# Patient Record
Sex: Female | Born: 1950 | Race: White | Hispanic: No | Marital: Single | Smoking: Never smoker
Health system: Southern US, Community
[De-identification: ages and names within clinical notes are randomized; demographics above are authoritative.]

## PROBLEM LIST (undated history)

## (undated) DIAGNOSIS — M1990 Unspecified osteoarthritis, unspecified site: Secondary | ICD-10-CM

## (undated) DIAGNOSIS — I10 Essential (primary) hypertension: Secondary | ICD-10-CM

---

## 1998-03-31 ENCOUNTER — Emergency Department (HOSPITAL_COMMUNITY): Admission: EM | Admit: 1998-03-31 | Discharge: 1998-03-31 | Payer: Self-pay | Admitting: *Deleted

## 2000-03-27 DIAGNOSIS — F32A Depression, unspecified: Secondary | ICD-10-CM | POA: Insufficient documentation

## 2004-03-16 ENCOUNTER — Emergency Department: Payer: Self-pay | Admitting: Emergency Medicine

## 2004-07-31 ENCOUNTER — Emergency Department: Payer: Self-pay | Admitting: Emergency Medicine

## 2004-12-28 ENCOUNTER — Emergency Department: Payer: Self-pay | Admitting: Internal Medicine

## 2005-01-22 ENCOUNTER — Emergency Department: Payer: Self-pay | Admitting: Emergency Medicine

## 2005-10-17 ENCOUNTER — Emergency Department: Payer: Self-pay | Admitting: Emergency Medicine

## 2006-01-19 ENCOUNTER — Emergency Department: Payer: Self-pay | Admitting: Emergency Medicine

## 2007-03-26 ENCOUNTER — Ambulatory Visit: Payer: Self-pay | Admitting: Pain Medicine

## 2007-04-01 ENCOUNTER — Ambulatory Visit: Payer: Self-pay | Admitting: Pain Medicine

## 2007-05-26 ENCOUNTER — Ambulatory Visit: Payer: Self-pay | Admitting: Pain Medicine

## 2007-06-01 ENCOUNTER — Ambulatory Visit: Payer: Self-pay | Admitting: Pain Medicine

## 2007-06-08 ENCOUNTER — Ambulatory Visit: Payer: Self-pay | Admitting: Pain Medicine

## 2007-06-22 ENCOUNTER — Ambulatory Visit: Payer: Self-pay | Admitting: Unknown Physician Specialty

## 2007-07-07 ENCOUNTER — Ambulatory Visit: Payer: Self-pay | Admitting: Unknown Physician Specialty

## 2007-07-09 ENCOUNTER — Ambulatory Visit: Payer: Self-pay | Admitting: Pain Medicine

## 2007-07-15 ENCOUNTER — Ambulatory Visit: Payer: Self-pay | Admitting: Pain Medicine

## 2007-07-31 ENCOUNTER — Ambulatory Visit: Payer: Self-pay | Admitting: Unknown Physician Specialty

## 2007-08-04 ENCOUNTER — Ambulatory Visit: Payer: Self-pay | Admitting: Pain Medicine

## 2007-08-12 ENCOUNTER — Ambulatory Visit: Payer: Self-pay | Admitting: Pain Medicine

## 2007-08-15 ENCOUNTER — Emergency Department: Payer: Self-pay | Admitting: Emergency Medicine

## 2007-08-15 ENCOUNTER — Other Ambulatory Visit: Payer: Self-pay

## 2007-08-17 ENCOUNTER — Ambulatory Visit: Payer: Self-pay | Admitting: Unknown Physician Specialty

## 2007-08-20 ENCOUNTER — Ambulatory Visit: Payer: Self-pay | Admitting: Family Medicine

## 2007-09-03 ENCOUNTER — Ambulatory Visit: Payer: Self-pay | Admitting: Pain Medicine

## 2007-09-07 ENCOUNTER — Ambulatory Visit: Payer: Self-pay | Admitting: Pain Medicine

## 2007-09-10 ENCOUNTER — Ambulatory Visit: Payer: Self-pay | Admitting: Family Medicine

## 2007-09-10 ENCOUNTER — Other Ambulatory Visit: Payer: Self-pay

## 2007-09-10 ENCOUNTER — Emergency Department: Payer: Self-pay

## 2007-09-30 ENCOUNTER — Ambulatory Visit: Payer: Self-pay | Admitting: Pain Medicine

## 2007-10-09 ENCOUNTER — Ambulatory Visit: Payer: Self-pay | Admitting: Internal Medicine

## 2007-10-10 ENCOUNTER — Emergency Department: Payer: Self-pay | Admitting: Unknown Physician Specialty

## 2007-10-12 ENCOUNTER — Ambulatory Visit: Payer: Self-pay | Admitting: Pain Medicine

## 2007-10-28 ENCOUNTER — Ambulatory Visit: Payer: Self-pay | Admitting: Unknown Physician Specialty

## 2007-11-05 ENCOUNTER — Ambulatory Visit: Payer: Self-pay | Admitting: Unknown Physician Specialty

## 2007-12-01 ENCOUNTER — Emergency Department: Payer: Self-pay | Admitting: Emergency Medicine

## 2008-02-27 ENCOUNTER — Emergency Department: Payer: Self-pay | Admitting: Emergency Medicine

## 2008-03-02 ENCOUNTER — Ambulatory Visit: Payer: Self-pay | Admitting: Unknown Physician Specialty

## 2008-03-07 ENCOUNTER — Emergency Department: Payer: Self-pay | Admitting: Emergency Medicine

## 2008-04-04 ENCOUNTER — Ambulatory Visit: Payer: Self-pay | Admitting: Pain Medicine

## 2008-04-09 ENCOUNTER — Ambulatory Visit: Payer: Self-pay | Admitting: Family Medicine

## 2008-04-25 ENCOUNTER — Ambulatory Visit: Payer: Self-pay | Admitting: Internal Medicine

## 2008-05-24 ENCOUNTER — Emergency Department: Payer: Self-pay | Admitting: Emergency Medicine

## 2008-07-06 ENCOUNTER — Ambulatory Visit: Payer: Self-pay | Admitting: Gastroenterology

## 2008-07-06 ENCOUNTER — Ambulatory Visit: Payer: Self-pay | Admitting: Unknown Physician Specialty

## 2008-07-11 ENCOUNTER — Ambulatory Visit: Payer: Self-pay | Admitting: Unknown Physician Specialty

## 2008-07-15 ENCOUNTER — Ambulatory Visit: Payer: Self-pay | Admitting: Internal Medicine

## 2008-08-02 ENCOUNTER — Ambulatory Visit: Payer: Self-pay | Admitting: Unknown Physician Specialty

## 2008-08-03 ENCOUNTER — Emergency Department: Payer: Self-pay | Admitting: Emergency Medicine

## 2008-10-12 ENCOUNTER — Ambulatory Visit: Payer: Self-pay | Admitting: Family Medicine

## 2008-10-16 ENCOUNTER — Ambulatory Visit: Payer: Self-pay | Admitting: Internal Medicine

## 2008-11-01 ENCOUNTER — Ambulatory Visit: Payer: Self-pay | Admitting: Unknown Physician Specialty

## 2009-01-01 ENCOUNTER — Emergency Department: Payer: Self-pay | Admitting: Emergency Medicine

## 2009-03-02 ENCOUNTER — Ambulatory Visit: Payer: Self-pay | Admitting: Gastroenterology

## 2009-03-24 ENCOUNTER — Emergency Department: Payer: Self-pay | Admitting: Emergency Medicine

## 2009-04-14 ENCOUNTER — Emergency Department: Payer: Self-pay | Admitting: Internal Medicine

## 2009-05-15 ENCOUNTER — Emergency Department: Payer: Self-pay

## 2009-05-22 DIAGNOSIS — M797 Fibromyalgia: Secondary | ICD-10-CM | POA: Insufficient documentation

## 2009-05-22 DIAGNOSIS — M1990 Unspecified osteoarthritis, unspecified site: Secondary | ICD-10-CM | POA: Insufficient documentation

## 2009-05-22 DIAGNOSIS — K2100 Gastro-esophageal reflux disease with esophagitis, without bleeding: Secondary | ICD-10-CM | POA: Insufficient documentation

## 2009-06-02 ENCOUNTER — Ambulatory Visit: Payer: Self-pay | Admitting: Family Medicine

## 2009-06-12 ENCOUNTER — Emergency Department: Payer: Self-pay | Admitting: Emergency Medicine

## 2009-06-13 ENCOUNTER — Emergency Department: Payer: Self-pay | Admitting: Emergency Medicine

## 2009-07-15 ENCOUNTER — Emergency Department: Payer: Self-pay | Admitting: Unknown Physician Specialty

## 2009-07-18 ENCOUNTER — Ambulatory Visit: Payer: Self-pay | Admitting: Gastroenterology

## 2009-07-20 ENCOUNTER — Ambulatory Visit: Payer: Self-pay | Admitting: Gastroenterology

## 2009-09-03 ENCOUNTER — Emergency Department: Payer: Self-pay | Admitting: Unknown Physician Specialty

## 2009-09-29 DIAGNOSIS — I517 Cardiomegaly: Secondary | ICD-10-CM | POA: Insufficient documentation

## 2010-02-02 ENCOUNTER — Emergency Department: Payer: Self-pay | Admitting: Emergency Medicine

## 2010-02-13 ENCOUNTER — Ambulatory Visit: Payer: Self-pay | Admitting: Internal Medicine

## 2010-04-02 ENCOUNTER — Ambulatory Visit: Payer: Self-pay | Admitting: Unknown Physician Specialty

## 2010-04-02 DIAGNOSIS — I10 Essential (primary) hypertension: Secondary | ICD-10-CM

## 2010-04-09 ENCOUNTER — Ambulatory Visit: Payer: Self-pay | Admitting: Internal Medicine

## 2010-04-12 ENCOUNTER — Emergency Department: Payer: Self-pay | Admitting: Emergency Medicine

## 2010-05-14 ENCOUNTER — Inpatient Hospital Stay: Payer: Self-pay | Admitting: Unknown Physician Specialty

## 2010-08-10 ENCOUNTER — Emergency Department: Payer: Self-pay | Admitting: Emergency Medicine

## 2010-08-18 ENCOUNTER — Emergency Department: Payer: Self-pay | Admitting: Emergency Medicine

## 2010-08-22 ENCOUNTER — Emergency Department: Payer: Self-pay | Admitting: Unknown Physician Specialty

## 2010-09-25 ENCOUNTER — Ambulatory Visit: Payer: Self-pay | Admitting: Gastroenterology

## 2011-03-06 IMAGING — NM NUCLEAR MEDICINE HEPATOHBILIARY INCLUDE GB
2 series · 12 of 12 positions shown · non-contrast
Comparison: none

REASON FOR EXAM: RUQ pain  epigastric pain
COMMENTS:

[Series 1000: gallbladder dynamic · 4.80mm/px · 6 of 60 frames shown]
[frame 6/60]
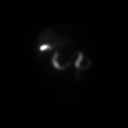
[frame 16/60]
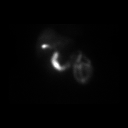
[frame 26/60]
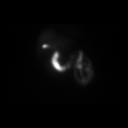
[frame 36/60]
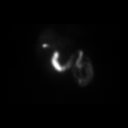
[frame 46/60]
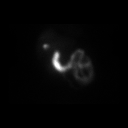
[frame 56/60]
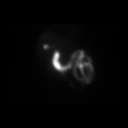

[Series 1000: gallbladder dynamic (results) · 4.80mm/px · 6 of 60 frames shown]
[frame 6/60]
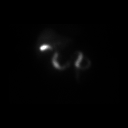
[frame 16/60]
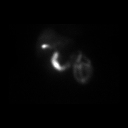
[frame 26/60]
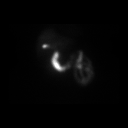
[frame 36/60]
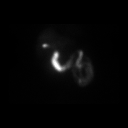
[frame 46/60]
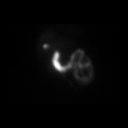
[frame 56/60]
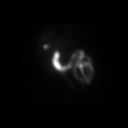

[12 of 12 positions shown; findings below may reference images not displayed]

PROCEDURE:     NM  - NM HEPATO WITH GB EJECT FRACTION  - July 11, 2008 [DATE]

RESULT:     The patient received an injection of 7.86 mCi of technetium 99m
Choletec. There is prompt extraction of activity from the blood pool by the
liver. There is evidence of activity within the gallbladder and common bile
duct by 10 minutes with small bowel activity well seen by 15 minutes and
progression of increasing activity in the gallbladder and small bowel
throughout the exam. A continuous infusion of sincalide was initiated with a
total infused dose measuring 2.5 mcg administered intravenously over 30
minutes. The gallbladder ejection fraction in response to sincalide infusion
is 89%.
IMPRESSION: 1.     Normal-appearing hepatobiliary scan and gallbladder ejection fraction.

## 2011-04-23 ENCOUNTER — Ambulatory Visit: Payer: Self-pay | Admitting: Internal Medicine

## 2011-06-04 ENCOUNTER — Ambulatory Visit: Payer: Self-pay | Admitting: Otolaryngology

## 2011-06-11 ENCOUNTER — Inpatient Hospital Stay: Payer: Self-pay | Admitting: Otolaryngology

## 2011-06-11 LAB — BASIC METABOLIC PANEL WITH GFR
Anion Gap: 8
BUN: 8 mg/dL
Calcium, Total: 7.9 mg/dL — ABNORMAL LOW
Chloride: 102 mmol/L
Co2: 27 mmol/L
Creatinine: 0.84 mg/dL
EGFR (African American): >60
EGFR (Non-African Amer.): >60
Glucose: 130 mg/dL — ABNORMAL HIGH
Osmolality: 274
Potassium: 4 mmol/L
Sodium: 137 mmol/L

## 2011-06-11 LAB — CALCIUM: Calcium, Total: 7.4 mg/dL — ABNORMAL LOW

## 2011-06-11 LAB — MAGNESIUM: Magnesium: 1.8 mg/dL

## 2011-06-11 LAB — ALBUMIN: Albumin: 3.3 g/dL — ABNORMAL LOW

## 2011-06-12 LAB — BASIC METABOLIC PANEL
Anion Gap: 8 (ref 7–16)
Calcium, Total: 7.4 mg/dL — ABNORMAL LOW (ref 8.5–10.1)
Chloride: 101 mmol/L (ref 98–107)
Co2: 29 mmol/L (ref 21–32)
Creatinine: 0.82 mg/dL (ref 0.60–1.30)
EGFR (African American): >60
EGFR (Non-African Amer.): >60
Glucose: 115 mg/dL — ABNORMAL HIGH (ref 65–99)
Osmolality: 276 (ref 275–301)
Potassium: 4 mmol/L (ref 3.5–5.1)

## 2011-06-13 LAB — PATHOLOGY REPORT

## 2011-06-13 LAB — CALCIUM
Calcium, Total: 7.3 mg/dL — ABNORMAL LOW (ref 8.5–10.1)
Calcium, Total: 6.9 mg/dL — CL (ref 8.5–10.1)

## 2011-06-14 LAB — CALCIUM: Calcium, Total: 7.5 mg/dL — ABNORMAL LOW (ref 8.5–10.1)

## 2011-06-20 DIAGNOSIS — E209 Hypoparathyroidism, unspecified: Secondary | ICD-10-CM | POA: Insufficient documentation

## 2012-08-10 DIAGNOSIS — G2581 Restless legs syndrome: Secondary | ICD-10-CM | POA: Insufficient documentation

## 2012-08-10 DIAGNOSIS — E039 Hypothyroidism, unspecified: Secondary | ICD-10-CM | POA: Insufficient documentation

## 2012-08-10 DIAGNOSIS — J449 Chronic obstructive pulmonary disease, unspecified: Secondary | ICD-10-CM | POA: Insufficient documentation

## 2012-08-11 DIAGNOSIS — Z8639 Personal history of other endocrine, nutritional and metabolic disease: Secondary | ICD-10-CM | POA: Insufficient documentation

## 2012-08-11 DIAGNOSIS — G43909 Migraine, unspecified, not intractable, without status migrainosus: Secondary | ICD-10-CM | POA: Insufficient documentation

## 2012-08-11 DIAGNOSIS — R9431 Abnormal electrocardiogram [ECG] [EKG]: Secondary | ICD-10-CM | POA: Insufficient documentation

## 2012-10-21 DIAGNOSIS — G4700 Insomnia, unspecified: Secondary | ICD-10-CM | POA: Insufficient documentation

## 2013-01-13 ENCOUNTER — Emergency Department: Payer: Self-pay | Admitting: Emergency Medicine

## 2013-03-14 ENCOUNTER — Ambulatory Visit: Payer: Self-pay | Admitting: Internal Medicine

## 2013-03-14 LAB — RAPID INFLUENZA A&B ANTIGENS

## 2014-05-15 NOTE — Op Note (Signed)
PATIENT NAME:  Stephanie Mooney, Stephanie Mooney MR#:  [id number] DATE OF BIRTH:  02/27/1950  DATE OF PROCEDURE:  06/10/2011  PREOPERATIVE DIAGNOSES:  1. Nontoxic multinodular goiter.  2. Dysphagia with compressive symptoms.  POSTOPERATIVE DIAGNOSES:  1. Nontoxic multinodular goiter.  2. Dysphagia with compressive symptoms.   PROCEDURE PERFORMED: Minimally invasive total thyroidectomy with laryngeal nerve monitor.   SURGEON: Kyung Rudd, MD   ASSISTANT:  Fredrich Birks, MD  ANESTHESIA: General endotracheal anesthesia.   ESTIMATED BLOOD LOSS: 25.   IV FLUIDS: Please see anesthesia record.   COMPLICATIONS: None.   DRAINS/STENT PLACEMENTS: Surgicel and TLS drain.   SPECIMENS: Total thyroid.   INDICATIONS FOR PROCEDURE: The patient is a 64 year old female with a longstanding history of nontoxic multinodular goiter and compressive-type symptoms.   OPERATIVE FINDINGS: Numerable nodules in a large nontoxic multinodular goiter right side dominant. Bilateral recurrent laryngeal nerves were identified and monitored throughout the duration of the case and stimulated well into the case. Bilateral superior parathyroid glands identified and preserved and the left inferior parathyroid gland identified and preserved.   DESCRIPTION OF PROCEDURE: After the patient was identified in holding, benefits and risks of the procedure were discussed and consent was reviewed, the patient was taken to the operating room and placed in the supine position. General endotracheal anesthesia was induced and the patient was prepped and draped in a sterile fashion. Of note, general anesthesia was induced with endotracheal tube with laryngeal monitoring device attached. Intubation was performed with the glide laryngoscope for direct visualization.   The patient was prepped at this point and draped in a sterile fashion. A previously marked skin crease was used for an incision with a #15 blade. Dissection was carried down through  the subcutaneous tissues to a layer of the strap muscles using Bovie electrocautery. The median raphe was divided along vertically from the thyroid notch down to the sternal notch and dissection was carried down to the isthmus of the thyroid gland. Attention at this time was directed to the patient's right hemi thyroid. Using terris elevators, the sternohyoid and sternothyroid muscles were separated from the thyroid capsule. Dissection was carried laterally. This demonstrated large multinodular goiters that extended back posterior to the patient's trachea. Dissection was carried superiorly to the lateral edge to the superior pole and medially between the thyroid cartilage and the superior pole for isolation of the right superior thyroid vessels. These were ligated with a Harmonic scalpel. The right hemi thyroid was continued to be mobilized inferiorly. This was retracted out of incision site and dissection bluntly with a combination of a terris elevator and a pediatric right angle was made in the tracheoesophageal groove. The right recurrent laryngeal nerve was identified. A right superior parathyroid gland was also identified in this position. Careful dissection around the nerve was made and the nerve was stimulated robustly.  The remaining attachments of the right hemi thyroid were divided and Berry's ligament was divided and care was taken to avoid any injury to the right recurrent laryngeal nerve. The remaining attachments of the inferior pole were also divided and dissection was taken to the midline of the trachea on the isthmus. At this time, attention was directed to the patient's left hemi thyroid.  In a similar fashion, sternohyoid and sternothyroid muscles were separated from the thyroid gland using blunt techniques. Again innumerable nodules were identified in the left hemi-thyroid with a large superior dominant nodule that was hard in nature. The left superior pole was mobilized laterally and medially  through use of Joels using terris elevators. The left superior thyroid vessels were ligated using a Harmonic scalpel. Attention at this time was directed to mobilizing the gland inferiorly. This was done with the use of blunt techniques and the left thyroid gland was retracted and delivered through the incision site. Dissection was made in the tracheoesophageal groove for evaluation of the left recurrent laryngeal nerve. The left superior parathyroid gland was identified and preserved as well as the left inferior parathyroid gland was identified and preserved. The left recurrent laryngeal nerve was noted to be coursing in the tracheoesophageal groove. This was dissected until its insertion into the larynx and at this time with the nerve in total view the remaining attachments of the left hemi thyroid were divided as well as Berry's ligament was separated, and the remaining attachments of the thyroid to the trachea were divided as well. The total thyroid specimen was passed off the table for permanent pathological evaluation and a stitch was placed in the right superior pole. Visualization of the thyroid did not reveal any parathyroid nodules.   At this time, the patient's neck was copiously irrigated with sterile saline. The thyroid beds were visualized and hemostasis was achieved. Surgicel was placed along the thyroid bed bilaterally, and the strap muscles were closed in a figure-of-eight fashion [id number] Vicryl. Vicryl was also used to reapproximate subcutaneous tissues in an interrupted fashion and the skin was closed with Dermabond skin adhesive. Prior to closing of the skin and strap muscles, a small TLS drain was placed in the patient's left neck. At this time, the patient tolerated the procedure well and was taken to the PAC-U in good condition. .   ____________________________ Kyung Ruddreighton C. Crystall Donaldson, MD ccv:drc D: 06/10/2011 12:57:33 ET T: 06/10/2011 13:15:43 ET JOB#: [id number]  cc: Kyung Ruddreighton C. Heidi Lemay,  MD, <Dictator> Kyung RuddREIGHTON C Tarrell Debes MD ELECTRONICALLY SIGNED 06/10/2011 14:40

## 2014-05-15 NOTE — Consult Note (Signed)
PATIENT NAME:  Stephanie Mooney, Stephanie Mooney MR#:  [id number] DATE OF BIRTH:  [birth year]  DATE OF CONSULTATION:  06/10/2011  REFERRING PHYSICIAN:  Bud Facereighton Vaught, MD, ENT  CONSULTING PHYSICIAN:  Herschell Dimesichard J. Renae GlossWieting, MD   PRIMARY CARE PHYSICIAN[facility]   REASON FOR CONSULTATION: Medical management postop thyroidectomy.   HISTORY OF PRESENT ILLNESS: This is a 64 year old female with multiple medical issues including fibromyalgia, anxiety, gastroesophageal reflux disease, hypertension, kidney stones, headaches, and osteoarthritis. She was admitted electively for a thyroidectomy by Dr. Andee PolesVaught secondary to multinodular goiter, dysphasia with compressive symptoms, and enlarging nodules. The patient postop is feeling very tired, severe pain 8 out of 10 in intensity, was sleeping most of the time while history was obtained from old chart and daughter who is at the bedside. The patient does complain of 8 out of 10 pain in her throat. Hospitalist services were contacted for medical management status post thyroidectomy. Current vital signs include blood pressure 135/86, temperature 97.2, pulse 72, respirations 16, pulse oximetry 96% on room air. The patient is currently feeling fine besides the neck pain status post surgery.   PAST MEDICAL HISTORY:  1. Anxiety. 2. Fibromyalgia. 3. Gastric ulcer.  4. Gastroesophageal reflux disease.  5. Hypertension.  6. Kidney stones. 7. Migraine headache. 8. Osteoarthritis.   PAST SURGICAL HISTORY:  1. Knee replacement. 2. D and C.  3. Tonsillectomy.  4. Tubal ligation. 5. Arthroscopy of chondral lesions.  6. Partial medial meniscectomy.   7. Thyroidectomy today.   DRUG ALLERGIES: Celebrex, Cymbalta, Daypro, Feldene, Flexeril, gabapentin, Lyrica, and NSAIDs.  MEDICATIONS AT HOME:  1. Albuterol p.r.n.  2. Wellbutrin 300 mg daily.  3. Celexa 20 mg daily.  4. Lisinopril 10 mg daily.  5. Metoprolol ER 50 mg daily.  6. Remeron 15 mg at bedtime.   7. Nasonex p.r.n. 8. Nucynta 100 mg every six hours.  9. Omeprazole 20 mg daily.  10. Requip 2 mg at bedtime.   SOCIAL HISTORY: Positive for smoking. No alcohol. No drug use. Lives alone. Is on disability.   FAMILY HISTORY: Both parents are living. Father with atrial fibrillation and a defibrillator. Mother with CVA.   REVIEW OF SYSTEMS: CONSTITUTIONAL: No fever, chills, or sweats. No weight loss. No weight gain. EYES: She does wear glasses. EARS, NOSE, MOUTH, AND THROAT: No hearing loss or sore throat. No difficulty swallowing. CARDIOVASCULAR: No chest pain. No palpitations. RESPIRATORY: No shortness of breath. Positive coughing. No sputum. No hemoptysis. GASTROINTESTINAL: No nausea. No vomiting. No abdominal pain. No diarrhea. No constipation. No bright red blood per rectum. No melena. GENITOURINARY: No burning on urination. No hematuria. MUSCULOSKELETAL: No joint pain. INTEGUMENTARY: No rashes or eruptions. NEUROLOGIC: No fainting or blackouts. PSYCHIATRIC: Positive for anxiety and depression. ENDOCRINE: Status post thyroidectomy today. HEMATOLOGIC/LYMPHATIC: No anemia. No easy bruising or bleeding.   PHYSICAL EXAMINATION:   VITAL SIGNS: Blood pressure 135/86, pulse oximetry 96% on room air, respirations 16, pulse 72, temperature 97.8.   GENERAL: Obese female lying in bed. No respiratory distress.   EYES: Conjunctivae and lids normal. Pupils equal, round, and reactive to light. Extraocular muscles intact. No nystagmus.   EARS, NOSE, MOUTH, AND THROAT: Nasal mucosa no erythema. Throat no erythema. No exudate seen. Lips and gums no lesions.   NECK: Positive scar over the neck. Did not palpate area secondary to recent surgery.   RESPIRATORY: Lungs clear to auscultation. No use of accessory muscles to breathe. No rhonchi, rales, or wheeze heard.   CARDIOVASCULAR: S1, S2  normal. No gallops, rubs, or murmurs heard. Carotid upstroke 2+ bilaterally. No bruits. Dorsalis pedis pulses 2+  bilaterally. Trace edema of the lower extremity.   ABDOMEN: Soft, nontender. No organomegaly/splenomegaly. Normoactive bowel sounds.   LYMPHATIC: No lymph nodes in the neck.   MUSCULOSKELETAL: Trace edema. No clubbing. No cyanosis.   SKIN: No ulcers or lesions.   NEUROLOGIC: Cranial nerves II through XII grossly intact.   PSYCHIATRIC: The patient is oriented to person, place, and time, sleepy after procedure.   ASSESSMENT AND PLAN:  1. Hypertension, blood pressure currently well controlled on metoprolol and lisinopril. Will continue.  2. Status post thyroidectomy. Pain control with Nucynta. Morphine only if severe pain. The patient was started on thyroid replacement and need to watch calcium closely. There is a calcium ordered for later on today and in the morning. The patient is also on calcium supplementation. Surgery following.  3. Anxiety/depression. Continue Celexa and Remeron. Will hold on Wellbutrin today.  4. Gastroesophageal reflux disease. Continue omeprazole.  5. Restless leg syndrome. Continue Requip.  6. Obesity. BMI 54.7. Weight loss recommended.  7. Check a BMP in the a.m. with IV fluids ordered by surgeon.  8. Tobacco abuse. Will put on nicotine patch p.r.n.   TIME SPENT ON CONSULTATION: 50 minutes.   Thank you for the consultation. Will continue to follow while here in the hospital.   ____________________________ Herschell Dimes. Renae Gloss, MD rjw:drc D: 06/10/2011 16:25:29 ET T: 06/10/2011 16:52:44 ET JOB#: [id number]  cc: Herschell Dimes. Renae Gloss, MD, <Dictator> Kyung Rudd, MD Salley Scarlet MD ELECTRONICALLY SIGNED 06/13/2011 13:17

## 2014-06-02 DIAGNOSIS — G894 Chronic pain syndrome: Secondary | ICD-10-CM | POA: Insufficient documentation

## 2015-04-07 DIAGNOSIS — E785 Hyperlipidemia, unspecified: Secondary | ICD-10-CM | POA: Insufficient documentation

## 2016-12-26 DIAGNOSIS — M159 Polyosteoarthritis, unspecified: Secondary | ICD-10-CM | POA: Insufficient documentation

## 2018-03-10 DIAGNOSIS — Z0289 Encounter for other administrative examinations: Secondary | ICD-10-CM | POA: Insufficient documentation

## 2019-01-11 ENCOUNTER — Emergency Department: Payer: Medicare HMO

## 2019-01-11 ENCOUNTER — Inpatient Hospital Stay
Admission: EM | Admit: 2019-01-11 | Discharge: 2019-01-15 | DRG: 177 | Disposition: A | Discharge status: Home Health | Payer: Medicare HMO | Attending: Hospitalist | Admitting: Hospitalist

## 2019-01-11 DIAGNOSIS — Z886 Allergy status to analgesic agent status: Secondary | ICD-10-CM

## 2019-01-11 DIAGNOSIS — J1289 Other viral pneumonia: Secondary | ICD-10-CM | POA: Diagnosis present

## 2019-01-11 DIAGNOSIS — E871 Hypo-osmolality and hyponatremia: Secondary | ICD-10-CM | POA: Diagnosis present

## 2019-01-11 DIAGNOSIS — M1990 Unspecified osteoarthritis, unspecified site: Secondary | ICD-10-CM | POA: Diagnosis present

## 2019-01-11 DIAGNOSIS — Z23 Encounter for immunization: Secondary | ICD-10-CM

## 2019-01-11 DIAGNOSIS — U071 COVID-19: Principal | ICD-10-CM

## 2019-01-11 DIAGNOSIS — G2581 Restless legs syndrome: Secondary | ICD-10-CM | POA: Diagnosis present

## 2019-01-11 DIAGNOSIS — J9601 Acute respiratory failure with hypoxia: Secondary | ICD-10-CM

## 2019-01-11 DIAGNOSIS — E559 Vitamin D deficiency, unspecified: Secondary | ICD-10-CM | POA: Diagnosis present

## 2019-01-11 DIAGNOSIS — E785 Hyperlipidemia, unspecified: Secondary | ICD-10-CM | POA: Diagnosis present

## 2019-01-11 DIAGNOSIS — G8929 Other chronic pain: Secondary | ICD-10-CM | POA: Diagnosis present

## 2019-01-11 DIAGNOSIS — E213 Hyperparathyroidism, unspecified: Secondary | ICD-10-CM | POA: Diagnosis present

## 2019-01-11 DIAGNOSIS — J440 Chronic obstructive pulmonary disease with acute lower respiratory infection: Secondary | ICD-10-CM | POA: Diagnosis present

## 2019-01-11 DIAGNOSIS — Z66 Do not resuscitate: Secondary | ICD-10-CM | POA: Diagnosis present

## 2019-01-11 DIAGNOSIS — E890 Postprocedural hypothyroidism: Secondary | ICD-10-CM | POA: Diagnosis present

## 2019-01-11 DIAGNOSIS — I10 Essential (primary) hypertension: Secondary | ICD-10-CM | POA: Diagnosis present

## 2019-01-11 DIAGNOSIS — M797 Fibromyalgia: Secondary | ICD-10-CM | POA: Diagnosis present

## 2019-01-11 DIAGNOSIS — Z6841 Body Mass Index (BMI) 40.0 and over, adult: Secondary | ICD-10-CM

## 2019-01-11 DIAGNOSIS — N390 Urinary tract infection, site not specified: Secondary | ICD-10-CM | POA: Diagnosis present

## 2019-01-11 DIAGNOSIS — Z888 Allergy status to other drugs, medicaments and biological substances status: Secondary | ICD-10-CM

## 2019-01-11 DIAGNOSIS — E875 Hyperkalemia: Secondary | ICD-10-CM | POA: Diagnosis present

## 2019-01-11 HISTORY — DX: Unspecified osteoarthritis, unspecified site: M19.90

## 2019-01-11 HISTORY — DX: Essential (primary) hypertension: I10

## 2019-01-11 LAB — CBC
HCT: 42.5 % (ref 36.0–46.0)
Hemoglobin: 13.8 g/dL (ref 12.0–15.0)
MCH: 27.4 pg (ref 26.0–34.0)
MCHC: 32.5 g/dL (ref 30.0–36.0)
MCV: 84.5 fL (ref 80.0–100.0)
Platelets: 154 10*3/uL (ref 150–400)
RBC: 5.03 MIL/uL (ref 3.87–5.11)
RDW: 14.6 % (ref 11.5–15.5)
WBC: 4.5 10*3/uL (ref 4.0–10.5)
nRBC: 0 % (ref 0.0–0.2)

## 2019-01-11 LAB — FIBRIN DERIVATIVES D-DIMER (ARMC ONLY): Fibrin derivatives D-dimer (ARMC): 718.55 ng/mL (FEU) — ABNORMAL HIGH (ref 0.00–499.00)

## 2019-01-11 LAB — BASIC METABOLIC PANEL
Anion gap: 11 (ref 5–15)
BUN: 19 mg/dL (ref 8–23)
CO2: 28 mmol/L (ref 22–32)
Calcium: 7.9 mg/dL — ABNORMAL LOW (ref 8.9–10.3)
Chloride: 94 mmol/L — ABNORMAL LOW (ref 98–111)
Creatinine, Ser: 0.65 mg/dL (ref 0.44–1.00)
GFR calc Af Amer: >60 mL/min (ref >60)
GFR calc non Af Amer: >60 mL/min (ref >60)
Glucose, Bld: 122 mg/dL — ABNORMAL HIGH (ref 70–99)
Potassium: 4.6 mmol/L (ref 3.5–5.1)
Sodium: 133 mmol/L — ABNORMAL LOW (ref 135–145)

## 2019-01-11 LAB — HEPATIC FUNCTION PANEL
ALT: 27 U/L (ref 0–44)
AST: 39 U/L (ref 15–41)
Albumin: 3.4 g/dL — ABNORMAL LOW (ref 3.5–5.0)
Alkaline Phosphatase: 73 U/L (ref 38–126)
Bilirubin, Direct: 0.2 mg/dL (ref 0.0–0.2)
Indirect Bilirubin: 0.6 mg/dL (ref 0.3–0.9)
Total Bilirubin: 0.8 mg/dL (ref 0.3–1.2)
Total Protein: 6.6 g/dL (ref 6.5–8.1)

## 2019-01-11 LAB — URINALYSIS, COMPLETE (UACMP) WITH MICROSCOPIC
Bilirubin Urine: NEGATIVE
Glucose, UA: NEGATIVE mg/dL
Hgb urine dipstick: NEGATIVE
Ketones, ur: NEGATIVE mg/dL
Nitrite: POSITIVE — AB
Protein, ur: 30 mg/dL — AB
Specific Gravity, Urine: 1.024 (ref 1.005–1.030)
WBC, UA: >50 WBC/hpf — ABNORMAL HIGH (ref 0–5)
pH: 6 (ref 5.0–8.0)

## 2019-01-11 LAB — ABO/RH: ABO/RH(D): A NEG

## 2019-01-11 LAB — C-REACTIVE PROTEIN: CRP: 2.8 mg/dL — ABNORMAL HIGH (ref <1.0)

## 2019-01-11 LAB — PROCALCITONIN: Procalcitonin: <0.1 ng/mL

## 2019-01-11 MED ORDER — LEVOTHYROXINE SODIUM 25 MCG PO TABS
125.0000 ug | ORAL_TABLET | Freq: Every day | ORAL | Status: DC
  Administered 2019-01-12 – 2019-01-15 (×4): 125 ug via ORAL
  Filled 2019-01-11 (×4): qty 1

## 2019-01-11 MED ORDER — THIAMINE HCL 100 MG PO TABS
100.0000 mg | ORAL_TABLET | Freq: Every day | ORAL | Status: DC
  Administered 2019-01-11 – 2019-01-15 (×5): 100 mg via ORAL
  Filled 2019-01-11 (×5): qty 1

## 2019-01-11 MED ORDER — ONDANSETRON HCL 4 MG/2ML IJ SOLN: 4.0000 mg | Freq: Once | INTRAMUSCULAR | Status: DC

## 2019-01-11 MED ORDER — ADULT MULTIVITAMIN W/MINERALS CH
1.0000 | ORAL_TABLET | Freq: Every day | ORAL | Status: DC
  Administered 2019-01-11 – 2019-01-14 (×4): 1 via ORAL
  Filled 2019-01-11 (×4): qty 1

## 2019-01-11 MED ORDER — SODIUM CHLORIDE 0.9 % IV SOLN: Freq: Once | INTRAVENOUS | Status: DC

## 2019-01-11 MED ORDER — TRAMADOL HCL 50 MG PO TABS
50.0000 mg | ORAL_TABLET | Freq: Once | ORAL | Status: AC
  Administered 2019-01-11: 50 mg via ORAL
  Filled 2019-01-11: qty 1

## 2019-01-11 MED ORDER — ACETAMINOPHEN 325 MG PO TABS
650.0000 mg | ORAL_TABLET | Freq: Four times a day (QID) | ORAL | Status: DC | PRN
  Administered 2019-01-13: 650 mg via ORAL
  Filled 2019-01-11: qty 2

## 2019-01-11 MED ORDER — FOLIC ACID 1 MG PO TABS
1.0000 mg | ORAL_TABLET | Freq: Every day | ORAL | Status: DC
  Administered 2019-01-11 – 2019-01-15 (×5): 1 mg via ORAL
  Filled 2019-01-11 (×5): qty 1

## 2019-01-11 MED ORDER — CYCLOBENZAPRINE HCL 10 MG PO TABS
5.0000 mg | ORAL_TABLET | Freq: Every day | ORAL | Status: DC
  Administered 2019-01-11 – 2019-01-14 (×4): 5 mg via ORAL
  Filled 2019-01-11 (×4): qty 1

## 2019-01-11 MED ORDER — METOPROLOL SUCCINATE ER 50 MG PO TB24
50.0000 mg | ORAL_TABLET | Freq: Every day | ORAL | Status: DC
  Administered 2019-01-11: 50 mg via ORAL
  Filled 2019-01-11 (×2): qty 1

## 2019-01-11 MED ORDER — ATORVASTATIN CALCIUM 10 MG PO TABS
10.0000 mg | ORAL_TABLET | Freq: Every day | ORAL | Status: DC
  Administered 2019-01-11 – 2019-01-15 (×5): 10 mg via ORAL
  Filled 2019-01-11 (×5): qty 1

## 2019-01-11 MED ORDER — BISACODYL 5 MG PO TBEC: 5.0000 mg | DELAYED_RELEASE_TABLET | Freq: Every day | ORAL | Status: DC | PRN

## 2019-01-11 MED ORDER — ACETAMINOPHEN 500 MG PO TABS
1000.0000 mg | ORAL_TABLET | Freq: Once | ORAL | Status: AC
  Administered 2019-01-11: 1000 mg via ORAL
  Filled 2019-01-11: qty 2

## 2019-01-11 MED ORDER — LISINOPRIL 10 MG PO TABS
10.0000 mg | ORAL_TABLET | Freq: Every day | ORAL | Status: DC
  Administered 2019-01-11: 10 mg via ORAL
  Filled 2019-01-11 (×3): qty 1

## 2019-01-11 MED ORDER — SODIUM CHLORIDE 0.9% FLUSH: 10.0000 mL | INTRAVENOUS | Status: DC | PRN

## 2019-01-11 MED ORDER — SODIUM CHLORIDE 0.9 % IV SOLN
200.0000 mg | Freq: Once | INTRAVENOUS | Status: AC
  Administered 2019-01-11: 200 mg via INTRAVENOUS
  Filled 2019-01-11: qty 200

## 2019-01-11 MED ORDER — SODIUM CHLORIDE 0.9 % IV SOLN
1.0000 g | Freq: Once | INTRAVENOUS | Status: AC
  Administered 2019-01-11: 1 g via INTRAVENOUS
  Filled 2019-01-11: qty 10

## 2019-01-11 MED ORDER — ASCORBIC ACID 500 MG PO TABS
500.0000 mg | ORAL_TABLET | Freq: Every day | ORAL | Status: DC
  Administered 2019-01-11 – 2019-01-15 (×5): 500 mg via ORAL
  Filled 2019-01-11 (×4): qty 1

## 2019-01-11 MED ORDER — ROPINIROLE HCL 1 MG PO TABS
4.0000 mg | ORAL_TABLET | Freq: Every day | ORAL | Status: DC
  Administered 2019-01-11 – 2019-01-14 (×4): 4 mg via ORAL
  Filled 2019-01-11 (×4): qty 4

## 2019-01-11 MED ORDER — DEXAMETHASONE SODIUM PHOSPHATE 10 MG/ML IJ SOLN
6.0000 mg | INTRAMUSCULAR | Status: DC
  Administered 2019-01-12 – 2019-01-14 (×3): 6 mg via INTRAVENOUS
  Filled 2019-01-11 (×4): qty 0.6

## 2019-01-11 MED ORDER — SODIUM CHLORIDE 0.9 % IV SOLN
1.0000 g | INTRAVENOUS | Status: DC
  Filled 2019-01-11: qty 10

## 2019-01-11 MED ORDER — DOCUSATE SODIUM 100 MG PO CAPS
100.0000 mg | ORAL_CAPSULE | Freq: Two times a day (BID) | ORAL | Status: DC
  Administered 2019-01-11 – 2019-01-15 (×8): 100 mg via ORAL
  Filled 2019-01-11 (×8): qty 1

## 2019-01-11 MED ORDER — MOMETASONE FURO-FORMOTEROL FUM 200-5 MCG/ACT IN AERO
2.0000 | INHALATION_SPRAY | Freq: Two times a day (BID) | RESPIRATORY_TRACT | Status: DC
  Administered 2019-01-11 – 2019-01-15 (×8): 2 via RESPIRATORY_TRACT
  Filled 2019-01-11: qty 8.8

## 2019-01-11 MED ORDER — SODIUM CHLORIDE 0.9 % IV SOLN: INTRAVENOUS | Status: DC

## 2019-01-11 MED ORDER — ONDANSETRON HCL 4 MG PO TABS: 4.0000 mg | ORAL_TABLET | Freq: Four times a day (QID) | ORAL | Status: DC | PRN

## 2019-01-11 MED ORDER — SODIUM CHLORIDE 0.9 % IV BOLUS
1000.0000 mL | Freq: Once | INTRAVENOUS | Status: AC
  Administered 2019-01-11: 1000 mL via INTRAVENOUS

## 2019-01-11 MED ORDER — SODIUM CHLORIDE 0.9 % IV SOLN
100.0000 mg | Freq: Every day | INTRAVENOUS | Status: AC
  Administered 2019-01-12 – 2019-01-15 (×4): 100 mg via INTRAVENOUS
  Filled 2019-01-11: qty 100
  Filled 2019-01-11: qty 20
  Filled 2019-01-11: qty 100
  Filled 2019-01-11: qty 20

## 2019-01-11 MED ORDER — ENOXAPARIN SODIUM 80 MG/0.8ML ~~LOC~~ SOLN
65.0000 mg | SUBCUTANEOUS | Status: DC
  Administered 2019-01-11 – 2019-01-14 (×4): 65 mg via SUBCUTANEOUS
  Filled 2019-01-11 (×4): qty 0.8

## 2019-01-11 MED ORDER — ONDANSETRON HCL 4 MG/2ML IJ SOLN: 4.0000 mg | Freq: Four times a day (QID) | INTRAMUSCULAR | Status: DC | PRN

## 2019-01-11 MED ORDER — SODIUM CHLORIDE 0.9% FLUSH
10.0000 mL | Freq: Two times a day (BID) | INTRAVENOUS | Status: DC
  Administered 2019-01-12 – 2019-01-15 (×7): 10 mL

## 2019-01-11 MED ORDER — ALBUTEROL SULFATE HFA 108 (90 BASE) MCG/ACT IN AERS
1.0000 | INHALATION_SPRAY | Freq: Four times a day (QID) | RESPIRATORY_TRACT | Status: DC
  Administered 2019-01-11 – 2019-01-12 (×4): 2 via RESPIRATORY_TRACT
  Filled 2019-01-11: qty 6.7

## 2019-01-11 MED ORDER — FAMOTIDINE 20 MG PO TABS
20.0000 mg | ORAL_TABLET | Freq: Every day | ORAL | Status: DC
  Administered 2019-01-12 – 2019-01-15 (×4): 20 mg via ORAL
  Filled 2019-01-11 (×6): qty 1

## 2019-01-11 MED ORDER — DEXAMETHASONE SODIUM PHOSPHATE 10 MG/ML IJ SOLN
10.0000 mg | Freq: Once | INTRAMUSCULAR | Status: AC
  Administered 2019-01-11: 10 mg via INTRAVENOUS
  Filled 2019-01-11: qty 1

## 2019-01-11 NOTE — Consult Note (Signed)
Remdesivir - Pharmacy Brief Note   O:  ALT: 39 CXR: New multifocal areas of airspace opacity, suspicious for atypical/viral pneumonia. SpO2: 95% on 2L nasal cannula   A/P:  Patient tested positive on 01/06/19 at [facility] but was not hypoxic and therefore discharged and told to quarantine. Admitted to [facility] today due to worsening shortness of breath, therefore will initiate therapy prior to COVID test results obtained here.  Remdesivir 200 mg IVPB once followed by 100 mg IVPB daily x 4 days.   .me 01/11/2019 5:29 PM

## 2019-01-11 NOTE — ED Notes (Signed)
Pt states generalized fatigue and aching everywhere. Denies any specific pain at this time. NAD. Bed locked and low, call light in reach.

## 2019-01-11 NOTE — ED Notes (Signed)
Placed on 2L [place] 95% at this time

## 2019-01-11 NOTE — ED Triage Notes (Signed)
Pt comes into the ED via EMS from home with c/o SOb, cough increased weakness, has been having sx since 12/10, was tested positive for covid 12/16. VSS per EMS

## 2019-01-11 NOTE — ED Provider Notes (Addendum)
[facility] Emergency Department Provider Note  ____________________________________________   First MD Initiated Contact with Patient 01/11/19 1458     (approximate)  I have reviewed the triage vital signs and the nursing notes.   HISTORY  Chief Complaint Covid and Fatigue    HPI [name] is a 68 y.o. female  Here with SOB, weakness. Pt reports her sx began approx 5 days ago, with cough, fever, SOB. Pt reports that since then, she's had progressively worsening fatigue, chills, cough, and SOB. SOB is now at rest as well. She's had associated nausea, loss of appetite, loss of taste of sense and smell. She reports diffuse body aches. Has been taking oTC med w/o relief. No alleviating factors. Has multiple COVID exposures in home. She reports she was tested at [facility] 5 days ago and was positive for COVID.       Past Medical History:  Diagnosis Date  . Arthritis   . Hypertension     There are no problems to display for this patient.   History reviewed. No pertinent surgical history.  Prior to Admission medications   Not on File    Allergies Gabapentin and Nsaids  No family history on file.  Social History Social History   Tobacco Use  . Smoking status: Never Smoker  Substance Use Topics  . Alcohol use: Not Currently  . Drug use: Not on file    Review of Systems  Review of Systems  Constitutional: Positive for fatigue. Negative for fever.  HENT: Negative for congestion and sore throat.   Eyes: Negative for visual disturbance.  Respiratory: Positive for cough and shortness of breath.   Cardiovascular: Negative for chest pain.  Gastrointestinal: Positive for nausea. Negative for abdominal pain, diarrhea and vomiting.  Genitourinary: Negative for flank pain.  Musculoskeletal: Positive for arthralgias and myalgias. Negative for back pain and neck pain.  Skin: Negative for rash and wound.  Neurological: Negative for  weakness.  All other systems reviewed and are negative.    ____________________________________________  PHYSICAL EXAM:      VITAL SIGNS: ED Triage Vitals [01/11/19 1225]  Enc Vitals Group     BP (!) 138/59     Pulse Rate 99     Resp 20     Temp 98.2 F (36.8 C)     Temp Source Oral     SpO2 91 %     Weight 285 lb (129.3 kg)     Height 5\' 1"  (1.549 m)     Head Circumference      Peak Flow      Pain Score 8     Pain Loc      Pain Edu?      Excl. in GC?      Physical Exam Vitals and nursing note reviewed.  Constitutional:      General: She is not in acute distress.    Appearance: She is well-developed.  HENT:     Head: Normocephalic and atraumatic.     Mouth/Throat:     Mouth: Mucous membranes are dry.  Eyes:     Conjunctiva/sclera: Conjunctivae normal.  Cardiovascular:     Rate and Rhythm: Regular rhythm. Tachycardia present.     Heart sounds: Normal heart sounds. No murmur. No friction rub.  Pulmonary:     Effort: Pulmonary effort is normal. No respiratory distress.     Breath sounds: Decreased air movement present. Examination of the right-lower field reveals rales. Examination of the left-lower  field reveals rales. Decreased breath sounds and rales present. No wheezing.  Abdominal:     General: There is no distension.     Palpations: Abdomen is soft.     Tenderness: There is no abdominal tenderness.  Musculoskeletal:     Cervical back: Neck supple.  Skin:    General: Skin is warm.     Capillary Refill: Capillary refill takes less than 2 seconds.  Neurological:     Mental Status: She is alert and oriented to person, place, and time.     Motor: No abnormal muscle tone.       ____________________________________________   LABS (all labs ordered are listed, but only abnormal results are displayed)  Labs Reviewed  BASIC METABOLIC PANEL - Abnormal; Notable for the following components:      Result Value   Sodium 133 (*)    Chloride 94 (*)     Glucose, Bld 122 (*)    Calcium 7.9 (*)    All other components within normal limits  URINALYSIS, COMPLETE (UACMP) WITH MICROSCOPIC - Abnormal; Notable for the following components:   Color, Urine AMBER (*)    APPearance CLOUDY (*)    Protein, ur 30 (*)    Nitrite POSITIVE (*)    Leukocytes,Ua MODERATE (*)    WBC, UA >50 (*)    Bacteria, UA MANY (*)    Non Squamous Epithelial PRESENT (*)    All other components within normal limits  URINE CULTURE  CULTURE, BLOOD (ROUTINE X 2)  CULTURE, BLOOD (ROUTINE X 2)  CBC  FIBRIN DERIVATIVES D-DIMER ([facility] ONLY)  C-REACTIVE PROTEIN  HEPATIC FUNCTION PANEL  PROCALCITONIN  CBG MONITORING, ED    ____________________________________________  EKG: Normal sinus rhythm, VR 85. PR 132, QRS 66, QTc 504. No acute ST elevations or depressions. ________________________________________  RADIOLOGY All imaging, including plain films, CT scans, and ultrasounds, independently reviewed by me, and interpretations confirmed via formal radiology reads.  ED MD interpretation:   CXR: Multifocal PNA, likely viral  Official radiology report(s): DG Chest 2 View  Result Date: 01/11/2019 CLINICAL DATA:  Worsening shortness of breath and cough. COVID-19 virus infection. EXAM: CHEST - 2 VIEW COMPARISON:  04/02/2010 FINDINGS: Heart size is at the upper limits of normal. New multifocal ill-defined areas of airspace opacity are seen in both lungs, suspicious for atypical/viral pneumonia. No evidence of pleural effusion. IMPRESSION: New multifocal areas of airspace opacity, suspicious for atypical/viral pneumonia. Electronically Signed   By: Stephanie Mooney M.D.   On: 01/11/2019 13:18    ____________________________________________  PROCEDURES   Procedure(s) performed (including Critical Care):  .Critical Care Performed by: Duffy Bruce, MD Authorized by: Duffy Bruce, MD   Critical care provider statement:    Critical care time (minutes):  35   Critical  care time was exclusive of:  Separately billable procedures and treating other patients and teaching time   Critical care was necessary to treat or prevent imminent or life-threatening deterioration of the following conditions:  Circulatory failure, cardiac failure and respiratory failure   Critical care was time spent personally by me on the following activities:  Development of treatment plan with patient or surrogate, discussions with consultants, evaluation of patient's response to treatment, examination of patient, obtaining history from patient or surrogate, ordering and performing treatments and interventions, ordering and review of laboratory studies, ordering and review of radiographic studies, pulse oximetry, re-evaluation of patient's condition and review of old charts   I assumed direction of critical care for this patient from  another provider in my specialty: no      ____________________________________________  INITIAL IMPRESSION / MDM / ASSESSMENT AND PLAN / ED COURSE  As part of my medical decision making, I reviewed the following data within the electronic MEDICAL RECORD NUMBER Nursing notes reviewed and incorporated, Old chart reviewed, Notes from prior ED visits, and [place] Controlled Substance Database       *Stephanie Mooney was evaluated in Emergency Department on 01/11/2019 for the symptoms described in the history of present illness. She was evaluated in the context of the global COVID-19 pandemic, which necessitated consideration that the patient might be at risk for infection with the SARS-CoV-2 virus that causes COVID-19. Institutional protocols and algorithms that pertain to the evaluation of patients at risk for COVID-19 are in a state of rapid change based on information released by regulatory bodies including the CDC and federal and state organizations. These policies and algorithms were followed during the patient's care in the ED.  Some ED evaluations and interventions may be  delayed as a result of limited staffing during the pandemic.*  Clinical Course as of Jan 10 1637  Mon Jan 11, 2019  [id number] 68 year old female here with known COVID-19 diagnosis and reported worsening fatigue, shortness of breath, and nausea.  Patient satting in the upper 87-89 range on room air on arrival.  She was placed on 2 L nasal cannula with improvement.  Chest x-ray is consistent with likely viral pneumonia.  Renal function appears to be at baseline.  [facility] which [facility] COVID-19 diagnosis.  Will admit for mild hypoxic respiratory failure secondary to COVID-19.  She has been given steroids and fluids.  IV Decadron given.   [CI]    Clinical Course User Index [CI] Shaune PollackIsaacs, Mekiah Wahler, MD    ADDENDUM: Pt also with +UTI. Will start Rocephin.  Medical Decision Making: As above.  ____________________________________________  FINAL CLINICAL IMPRESSION(S) / ED DIAGNOSES  Final diagnoses:  COVID-19  Acute hypoxemic respiratory failure due to COVID-19 [facility](HCC)  UTI (urinary tract infection) with pyuria     MEDICATIONS GIVEN DURING THIS VISIT:  Medications  sodium chloride 0.9 % bolus 1,000 mL (has no administration in time range)  0.9 %  sodium chloride infusion (has no administration in time range)  acetaminophen (TYLENOL) tablet 1,000 mg (has no administration in time range)  dexamethasone (DECADRON) injection 10 mg (has no administration in time range)  ondansetron (ZOFRAN) injection 4 mg (has no administration in time range)  remdesivir 200 mg in sodium chloride 0.9% 250 mL IVPB (has no administration in time range)    Followed by  remdesivir 100 mg in sodium chloride 0.9 % 100 mL IVPB (has no administration in time range)  cefTRIAXone (ROCEPHIN) 1 g in sodium chloride 0.9 % 100 mL IVPB (has no administration in time range)     ED Discharge Orders    None       Note:  This document was prepared using Dragon voice recognition software and may include  unintentional dictation errors.   Shaune PollackIsaacs, Juliocesar Blasius, MD 01/11/19 1608    Shaune PollackIsaacs, Aleli Navedo, MD 01/11/19 [id number]    Shaune PollackIsaacs, Kennedy Bohanon, MD 01/11/19 [phone]

## 2019-01-11 NOTE — ED Triage Notes (Signed)
Dx with COVID Wednesday, fatigue and cough with movement. Pt alert and oriented X4, cooperative, RR even and unlabored, color WNL. Pt in NAD.

## 2019-01-11 NOTE — ED Notes (Signed)
Iv team in with pt. Pt given phone to get registered.

## 2019-01-11 NOTE — H&P (Signed)
[place] [facility] [place] NAME: Stephanie Mooney    MR#:  [id number]  DATE OF BIRTH:  [birth year]  DATE OF ADMISSION:  01/11/2019  PRIMARY CARE PHYSICIAN: Patient, No Pcp Per   REQUESTING/REFERRING PHYSICIAN: Dr. Ellender Hose  Patient coming from : home   CHIEF COMPLAINT:   Increasing shortness of breath. Low oxygen saturation, weakness and poor PO intake  Patient tested COVID positive 12/16/ 2020 HISTORY OF PRESENT ILLNESS:  Stephanie Mooney  is a 68 y.o. female with a known history of pretension, migraine, asthma/COPD, hypothyroidism status post thyroidectomy, hyperparathyroidism, osteoarthritis, fibromyalgia history of renal insufficiency and vitamin D deficiency along with chronic pain disorder follows with [facility] pain clinic comes to the emergency room with increasing shortness of breath, weakness, fatigue ability with poor PO intake and generalized malaise.  Patient went to [facility] emergency room and was tested positive for COVID on 16 December. She went home since labs and vitals are stable. Continue to do poorly with above complaints came to the emergency room today.  ED course: ER patient saturations initially were in the mid-80s. She was placed on 2 L nasal cannula oxygen and her sats went up to 92 to 97%. Patient currently is not in respiratory distress able to complete sentence without getting short of breath or wheezing.  Chest x-ray showed by basilar airspace disease consistent with covered pneumonia. She also had UTI. Patient is tachycardic with respiratory rate in 20s to 30s. Heart rate 85 to 90. White count normal.  She is ordered to receive Decadron and Remdesivir. IV Rocephin for UTI. Patient is being admitted for further evaluation management.   PAST MEDICAL HISTORY:   Past Medical History:  Diagnosis Date  . Arthritis   . Hypertension     PAST SURGICAL HISTOIRY:  History reviewed. No pertinent surgical history.  SOCIAL  HISTORY:   Social History   Tobacco Use  . Smoking status: Never Smoker  Substance Use Topics  . Alcohol use: Not Currently    FAMILY HISTORY:  No family history on file.  DRUG ALLERGIES:   Allergies  Allergen Reactions  . Gabapentin   . Nsaids     REVIEW OF SYSTEMS:  Review of Systems  Constitutional: Positive for malaise/fatigue. Negative for chills, fever and weight loss.  HENT: Negative for ear discharge, ear pain and nosebleeds.   Eyes: Negative for blurred vision, pain and discharge.  Respiratory: Positive for shortness of breath. Negative for sputum production, wheezing and stridor.   Cardiovascular: Negative for chest pain, palpitations, orthopnea and PND.  Gastrointestinal: Negative for abdominal pain, diarrhea, nausea and vomiting.  Genitourinary: Negative for frequency and urgency.  Musculoskeletal: Positive for back pain, joint pain and myalgias.  Neurological: Positive for weakness. Negative for sensory change, speech change and focal weakness.  Psychiatric/Behavioral: Negative for depression and hallucinations. The patient is not nervous/anxious.      MEDICATIONS AT HOME:   Prior to Admission medications   Not on File      VITAL SIGNS:  Blood pressure 117/82, pulse 84, temperature 98.2 F (36.8 C), temperature source Oral, resp. rate (!) 29, height 5\' 1"  (1.549 m), weight 129.3 kg, SpO2 91 %.  PHYSICAL EXAMINATION:  GENERAL:  68 y.o.-year-old patient lying in the bed with no acute distress. Morbidly obese EYES: Pupils equal, round, reactive to light and accommodation. No scleral icterus.  HEENT: Head atraumatic, normocephalic. Oropharynx and nasopharynx clear.  NECK:  Supple, no jugular venous distention. No thyroid enlargement,  no tenderness.  LUNGS: distant breath sounds bilaterally, no wheezing, rales,rhonchi or crepitation. No use of accessory muscles of respiration.  CARDIOVASCULAR: S1, S2 normal. No murmurs, rubs, or gallops.  ABDOMEN: Soft,  nontender, nondistended. Bowel sounds present. No organomegaly or mass.  EXTREMITIES: No pedal edema, cyanosis, or clubbing.  NEUROLOGIC: Cranial nerves II through XII are intact. Muscle strength 5/5 in all extremities. Sensation intact. Gait not checked.  PSYCHIATRIC: The patient is alert and oriented x 3.  SKIN: No obvious rash, lesion, or ulcer.   LABORATORY PANEL:   CBC Recent Labs  Lab 01/11/19 1238  WBC 4.5  HGB 13.8  HCT 42.5  PLT 154   ------------------------------------------------------------------------------------------------------------------  Chemistries  Recent Labs  Lab 01/11/19 1238 01/11/19 1513  NA 133*  --   K 4.6  --   CL 94*  --   CO2 28  --   GLUCOSE 122*  --   BUN 19  --   CREATININE 0.65  --   CALCIUM 7.9*  --   AST  --  39  ALT  --  27  ALKPHOS  --  73  BILITOT  --  0.8   ------------------------------------------------------------------------------------------------------------------  Cardiac Enzymes No results for input(s): TROPONINI in the last 168 hours. ------------------------------------------------------------------------------------------------------------------  RADIOLOGY:  DG Chest 2 View  Result Date: 01/11/2019 CLINICAL DATA:  Worsening shortness of breath and cough. COVID-19 virus infection. EXAM: CHEST - 2 VIEW COMPARISON:  04/02/2010 FINDINGS: Heart size is at the upper limits of normal. New multifocal ill-defined areas of airspace opacity are seen in both lungs, suspicious for atypical/viral pneumonia. No evidence of pleural effusion. IMPRESSION: New multifocal areas of airspace opacity, suspicious for atypical/viral pneumonia. Electronically Signed   By: Danae Orleans M.D.   On: 01/11/2019 13:18    EKG:    IMPRESSION AND PLAN:   Stephanie Mooney  is a 68 y.o. female with a known history of pretension, migraine, asthma/COPD, hypothyroidism status post thyroidectomy, hyperparathyroidism, osteoarthritis, fibromyalgia history  of renal insufficiency and vitamin D deficiency along with chronic pain disorder follows with [facility] comes to the emergency room with increasing shortness of breath, weakness, fatigue ability with poor PO intake and generalized malaise.  *Acute hypoxic respiratory failure in the setting of COPD/asthma mild exacerbation with COVID pneumonia -admit to MedSurg -IV Decadron 6 mg daily -IV Remdesivir per protocol -multivitamin, folate, zinc, vitamin C - PRN nebs and inhalers -Pro calcitonin <0.10 -follow blood culture -D dimer elevated suspected due to COVID pneumonia  - CRP pending  *UTI -IV Rocephin -follow-up urine culture  *Hypertension -resume home meds once medications have been reconciled -Lisinopril and BB (home)  *Chronic pain, fibromyalgia, DJD -PRN Tylenol and Ultram -patient follows with [facility] pain clinic  *Hypothyroidism -resume Synthroid  *Morbid obesity  *Hyperlipidimia cont statins    Family Communication:none Consults:none Code Status:DNR--d/w pt in the ER and she request DNR/DNI DVT prophylaxis: lovenox  TOTAL TIME TAKING CARE OF THIS PATIENT: *55* minutes.    Enedina Finner M.D on 01/11/2019 at 5:08 PM  Between 7am to 6pm - Pager - [phone]  After 6pm go to [email or web address] - password [email or web address] [facility]    CC: Primary care physician; Patient, No Pcp Per

## 2019-01-11 NOTE — Progress Notes (Signed)
Anticoagulation monitoring(Lovenox):  68 yo female ordered Lovenox 40 mg Q24h  Filed Weights   01/11/19 1225  Weight: 285 lb (129.3 kg)   BMI 53.9 kg    Lab Results  Component Value Date   CREATININE 0.65 01/11/2019   CREATININE 0.82 06/12/2011   CREATININE 0.84 06/11/2011   Estimated Creatinine Clearance: 86.6 mL/min (by C-G formula based on SCr of 0.65 mg/dL). Hemoglobin & Hematocrit     Component Value Date/Time   HGB 13.8 01/11/2019 1238   HCT 42.5 01/11/2019 1238     Per Protocol for Patient with estCrcl > 30 ml/min and BMI > 35, D Dimer < 5 ng/mL , will transition to Lovenox 65 mg Q24h.

## 2019-01-11 NOTE — ED Notes (Signed)
ED TO INPATIENT HANDOFF REPORT  ED Nurse Name and Phone #:  Toma Copier [phone]  S Name/Age/Gender Stephanie Mooney 68 y.o. female Room/Bed: ED06A/ED06A  Code Status   Code Status: Not on file  Home/SNF/Other Home Patient oriented to: self, place, time and situation Is this baseline? Yes   Triage Complete: Triage complete  Chief Complaint Acute hypoxemic respiratory failure (HCC) [J96.01]  Triage Note Pt comes into the ED via EMS from home with c/o SOb, cough increased weakness, has been having sx since 12/10, was tested positive for covid 12/16. VSS per EMS  Dx with COVID Wednesday, fatigue and cough with movement. Pt alert and oriented X4, cooperative, RR even and unlabored, color WNL. Pt in NAD.     Allergies Allergies  Allergen Reactions  . Nortriptyline Swelling  . Pregabalin Swelling, Other (See Comments) and Hives  . Celecoxib Other (See Comments)  . Duloxetine Other (See Comments)    Visual disturbances   . Gabapentin   . Nsaids   . Zolpidem Other (See Comments)    hallucination hallucination hallucination hallucination   . Aspirin Nausea Only  . Fluticasone-Salmeterol Cough  . Piroxicam Rash and Other (See Comments)    Level of Care/Admitting Diagnosis ED Disposition    ED Disposition Condition Comment   Admit  Hospital Area: [facility] [[id number]]  Level of Care: Med-Surg [16]  Covid Evaluation: Confirmed COVID Positive  Diagnosis: Acute hypoxemic respiratory failure [facility]) [[id number]]  Admitting Physician: Joselyn Glassman  Attending Physician: Joselyn Glassman  Estimated length of stay: past midnight tomorrow  Certification:: I certify this patient will need inpatient services for at least 2 midnights       B Medical/Surgery History Past Medical History:  Diagnosis Date  . Arthritis   . Hypertension    History reviewed. No pertinent surgical history.   A IV Location/Drains/Wounds Patient Lines/Drains/Airways Status    Active Line/Drains/Airways    Name:   Placement date:   Placement time:   Site:   Days:   Midline Single Lumen 01/11/19 Midline Right Basilic 8 cm 0 cm   01/11/19    1715    Basilic   less than 1          Intake/Output Last 24 hours No intake or output data in the 24 hours ending 01/11/19 1748  Labs/Imaging Results for orders placed or performed during the hospital encounter of 01/11/19 (from the past 48 hour(s))  Urinalysis, Complete w Microscopic     Status: Abnormal   Collection Time: 01/11/19 12:27 PM  Result Value Ref Range   Color, Urine AMBER (A) YELLOW    Comment: BIOCHEMICALS MAY BE AFFECTED BY COLOR   APPearance CLOUDY (A) CLEAR   Specific Gravity, Urine 1.024 1.005 - 1.030   pH 6.0 5.0 - 8.0   Glucose, UA NEGATIVE NEGATIVE mg/dL   Hgb urine dipstick NEGATIVE NEGATIVE   Bilirubin Urine NEGATIVE NEGATIVE   Ketones, ur NEGATIVE NEGATIVE mg/dL   Protein, ur 30 (A) NEGATIVE mg/dL   Nitrite POSITIVE (A) NEGATIVE   Leukocytes,Ua MODERATE (A) NEGATIVE   RBC / HPF 0-5 0 - 5 RBC/hpf   WBC, UA >50 (H) 0 - 5 WBC/hpf   Bacteria, UA MANY (A) NONE SEEN   Squamous Epithelial / LPF 0-5 0 - 5   Mucus PRESENT    Non Squamous Epithelial PRESENT (A) NONE SEEN    Comment: Performed at [facility], [address], [place], [place] [address]  Basic metabolic  panel     Status: Abnormal   Collection Time: 01/11/19 12:38 PM  Result Value Ref Range   Sodium 133 (L) 135 - 145 mmol/L   Potassium 4.6 3.5 - 5.1 mmol/L   Chloride 94 (L) 98 - 111 mmol/L   CO2 28 22 - 32 mmol/L   Glucose, Bld 122 (H) 70 - 99 mg/dL   BUN 19 8 - 23 mg/dL   Creatinine, Ser 0.65 0.44 - 1.00 mg/dL   Calcium 7.9 (L) 8.9 - 10.3 mg/dL   GFR calc non Af Amer >60 >60 mL/min   GFR calc Af Amer >60 >60 mL/min   Anion gap 11 5 - 15    Comment: Performed at [facility], [address] [place], [place] [address]  CBC     Status: None   Collection Time: 01/11/19 12:38 PM  Result Value Ref Range    WBC 4.5 4.0 - 10.5 K/uL   RBC 5.03 3.87 - 5.11 MIL/uL   Hemoglobin 13.8 12.0 - 15.0 g/dL   HCT 42.5 36.0 - 46.0 %   MCV 84.5 80.0 - 100.0 fL   MCH 27.4 26.0 - 34.0 pg   MCHC 32.5 30.0 - 36.0 g/dL   RDW 14.6 11.5 - 15.5 %   Platelets 154 150 - 400 K/uL   nRBC 0.0 0.0 - 0.2 %    Comment: Performed at [facility], [address] [place], [place] [address]  Fibrin derivatives D-Dimer [facility] only)     Status: Abnormal   Collection Time: 01/11/19  3:13 PM  Result Value Ref Range   Fibrin derivatives D-dimer ([facility]) 718.55 (H) 0.00 - 499.00 ng/mL (FEU)    Comment: (NOTE) <> Exclusion of Venous Thromboembolism (VTE) - OUTPATIENT ONLY   (Emergency Department or [place])   0-499 ng/ml (FEU): With a low to intermediate pretest probability                      for VTE this test result excludes the diagnosis                      of VTE.   >499 ng/ml (FEU) : VTE not excluded; additional work up for VTE is                      required. <> Testing on Inpatients and Evaluation of Disseminated Intravascular   Coagulation (DIC) Reference Range:   0-499 ng/ml (FEU) Performed at [facility], [address] [place], [place] [address]   Hepatic function panel     Status: Abnormal   Collection Time: 01/11/19  3:13 PM  Result Value Ref Range   Total Protein 6.6 6.5 - 8.1 g/dL   Albumin 3.4 (L) 3.5 - 5.0 g/dL   AST 39 15 - 41 U/L   ALT 27 0 - 44 U/L   Alkaline Phosphatase 73 38 - 126 U/L   Total Bilirubin 0.8 0.3 - 1.2 mg/dL   Bilirubin, Direct 0.2 0.0 - 0.2 mg/dL   Indirect Bilirubin 0.6 0.3 - 0.9 mg/dL    Comment: Performed at [facility], [address] [place], [place] [address]  Procalcitonin - Baseline     Status: None   Collection Time: 01/11/19  3:13 PM  Result Value Ref Range   Procalcitonin <0.10 ng/mL    Comment:        Interpretation: PCT (Procalcitonin) <= 0.5 ng/mL: Systemic infection (sepsis) is not  likely. Local bacterial infection is  possible. (NOTE)       Sepsis PCT Algorithm           Lower Respiratory Tract                                      Infection PCT Algorithm    ----------------------------     ----------------------------         PCT < 0.25 ng/mL                PCT < 0.10 ng/mL         Strongly encourage             Strongly discourage   discontinuation of antibiotics    initiation of antibiotics    ----------------------------     -----------------------------       PCT 0.25 - 0.50 ng/mL            PCT 0.10 - 0.25 ng/mL               OR       >80% decrease in PCT            Discourage initiation of                                            antibiotics      Encourage discontinuation           of antibiotics    ----------------------------     -----------------------------         PCT >= 0.50 ng/mL              PCT 0.26 - 0.50 ng/mL               AND        <80% decrease in PCT             Encourage initiation of                                             antibiotics       Encourage continuation           of antibiotics    ----------------------------     -----------------------------        PCT >= 0.50 ng/mL                  PCT > 0.50 ng/mL               AND         increase in PCT                  Strongly encourage                                      initiation of antibiotics    Strongly encourage escalation           of antibiotics                                     -----------------------------  PCT <= 0.25 ng/mL                                                 OR                                        > 80% decrease in PCT                                     Discontinue / Do not initiate                                             antibiotics Performed at [facility], [address] [place], [place] [address]    DG Chest 2 View  Result Date: 01/11/2019 CLINICAL DATA:  Worsening shortness of breath and cough. COVID-19 virus  infection. EXAM: CHEST - 2 VIEW COMPARISON:  04/02/2010 FINDINGS: Heart size is at the upper limits of normal. New multifocal ill-defined areas of airspace opacity are seen in both lungs, suspicious for atypical/viral pneumonia. No evidence of pleural effusion. IMPRESSION: New multifocal areas of airspace opacity, suspicious for atypical/viral pneumonia. Electronically Signed   By: Danae Orleans M.D.   On: 01/11/2019 13:18    Pending Labs Unresulted Labs (From admission, onward)    Start     Ordered   01/11/19 1637  Urine culture  ONCE - STAT,   STAT     01/11/19 1637   01/11/19 1637  Blood culture (routine x 2)  BLOOD CULTURE X 2,   STAT     01/11/19 1637   01/11/19 1513  C-reactive protein  Once,   STAT     01/11/19 1513   Signed and Held  ABO/Rh  Once,   R     Signed and Held   Signed and Held  Creatinine, serum  (enoxaparin (LOVENOX)    CrCl >/= 30 ml/min)  Once,   R    Comments: Baseline for enoxaparin therapy IF NOT ALREADY DRAWN.    Signed and Held   Signed and Held  Creatinine, serum  (enoxaparin (LOVENOX)    CrCl >/= 30 ml/min)  Weekly,   R    Comments: while on enoxaparin therapy    Signed and Held          Vitals/Pain Today's Vitals   01/11/19 1233 01/11/19 1452 01/11/19 1600 01/11/19 1630  BP:  135/79 (!) 148/93 117/82  Pulse:  (!) 106  84  Resp:  16 (!) 39 (!) 29  Temp:      TempSrc:      SpO2: 95% 100%  91%  Weight:      Height:      PainSc:        Isolation Precautions No active isolations  Medications Medications  0.9 %  sodium chloride infusion (has no administration in time range)  ondansetron (ZOFRAN) injection 4 mg (has no administration in time range)  remdesivir 200 mg in sodium chloride 0.9% 250 mL IVPB (200 mg Intravenous New Bag/Given 01/11/19 1733)    Followed by  remdesivir 100 mg in sodium chloride 0.9 % 100 mL IVPB (has no administration in time range)  cefTRIAXone (ROCEPHIN) 1 g in sodium chloride 0.9 % 100 mL IVPB (has no administration  in time range)  famotidine (PEPCID) tablet 20 mg (has no administration in time range)  dexamethasone (DECADRON) injection 6 mg (has no administration in time range)  albuterol (VENTOLIN HFA) 108 (90 Base) MCG/ACT inhaler 1-2 puff (has no administration in time range)  mometasone-formoterol (DULERA) 200-5 MCG/ACT inhaler 2 puff (has no administration in time range)  sodium chloride flush (NS) 0.9 % injection 10-40 mL (has no administration in time range)  sodium chloride flush (NS) 0.9 % injection 10-40 mL (has no administration in time range)  sodium chloride 0.9 % bolus 1,000 mL (1,000 mLs Intravenous New Bag/Given 01/11/19 1732)  acetaminophen (TYLENOL) tablet 1,000 mg (1,000 mg Oral Given 01/11/19 1730)  dexamethasone (DECADRON) injection 10 mg (10 mg Intravenous Given 01/11/19 1731)    Mobility walks Low fall risk   Focused Assessments n/a   R Recommendations: See Admitting Provider Note  Report given to:   Additional Notes: n/a

## 2019-01-12 MED ORDER — ENSURE ENLIVE PO LIQD
237.0000 mL | Freq: Two times a day (BID) | ORAL | Status: DC
  Administered 2019-01-14 (×2): 237 mL via ORAL

## 2019-01-12 MED ORDER — HYDROXYZINE HCL 25 MG PO TABS
25.0000 mg | ORAL_TABLET | Freq: Four times a day (QID) | ORAL | Status: DC | PRN
  Administered 2019-01-12: 25 mg via ORAL
  Filled 2019-01-12 (×2): qty 1

## 2019-01-12 MED ORDER — INFLUENZA VAC A&B SA ADJ QUAD 0.5 ML IM PRSY
0.5000 mL | PREFILLED_SYRINGE | INTRAMUSCULAR | Status: AC
  Administered 2019-01-15: 0.5 mL via INTRAMUSCULAR
  Filled 2019-01-12: qty 0.5

## 2019-01-12 MED ORDER — ALBUTEROL SULFATE HFA 108 (90 BASE) MCG/ACT IN AERS
1.0000 | INHALATION_SPRAY | Freq: Four times a day (QID) | RESPIRATORY_TRACT | Status: DC | PRN
  Administered 2019-01-12: 1 via RESPIRATORY_TRACT
  Filled 2019-01-12: qty 6.7

## 2019-01-12 MED ORDER — BUTALBITAL-APAP-CAFFEINE 50-325-40 MG PO TABS
2.0000 | ORAL_TABLET | Freq: Once | ORAL | Status: AC
  Administered 2019-01-12: 2 via ORAL
  Filled 2019-01-12: qty 2

## 2019-01-12 NOTE — Evaluation (Addendum)
Physical Therapy Evaluation Patient Details Name: Stephanie Mooney MRN: [id number] DOB: 11-22-1950 Today's Date: 01/12/2019   History of Present Illness  [name] is a 68yoF who comes to [facility] 12/21 progressive hypoxia, progressive weakness. tested positive for COVID on 16 December. PMH: HTN, migraine, asthma/COPD, hypothyroidism status post thyroidectomy, hyperparathyroidism, osteoarthritis, fibromyalgia history of renal insufficiency and vitamin D deficiency along with chronic pain.  Clinical Impression  Pt admitted with above diagnosis. Pt currently with functional limitations due to the deficits listed below (see "PT Problem List"). Upon entry, pt in bed, awake and agreeable to participate. The pt is alert and oriented x4, pleasant, conversational, and generally a good historian. Pt recently weaned to 1L per RN. Pt 91% at rest, 87% at EOB, 88% after 3xSTS, and 88% s/p 49ft AMB. Unfortunately, severe fatigue precludes assessment of more extensive mobility. Typically pt is able to AMB in the community limited distances and speeds but able to participate in making groceries. Functional mobility assessment demonstrates increased effort/time requirements, poor tolerance, and need for physical assistance, whereas the patient performed these at a higher level of independence PTA. Encouraged pt to problem solve a means to stay on the 1st floor when returning to home, and a BSC will make it possible to perform sponge baths on first floor half-bath. Pt will benefit from skilled PT intervention to increase independence and safety with basic mobility in preparation for discharge to the venue listed below.       Follow Up Recommendations Home health PT;Supervision for mobility/OOB;Supervision - Intermittent    Equipment Recommendations  3in1 (PT);Other (comment)(rollator walker)    Recommendations for Other Services       Precautions / Restrictions Precautions Precautions: Fall Restrictions Weight  Bearing Restrictions: No      Mobility  Bed Mobility Overal bed mobility: Modified Independent             General bed mobility comments: strong but fatiguable  Transfers Overall transfer level: Modified independent Equipment used: Rolling walker (2 wheeled)             General transfer comment: near-max effort to perform up to 3 in a set, but requires recovery period.  Ambulation/Gait Ambulation/Gait assistance: Min guard Gait Distance (Feet): 12 Feet Assistive device: Rolling walker (2 wheeled) Gait Pattern/deviations: Step-to pattern     General Gait Details: appears weak and labored  Stairs            Wheelchair Mobility    Modified Rankin (Stroke Patients Only)       Balance Overall balance assessment: Modified Independent;Mild deficits observed, not formally tested                                           Pertinent Vitals/Pain Pain Assessment: No/denies pain    Home Living Family/patient expects to be discharged to:: Private residence Living Arrangements: Children;Other relatives(DTR, 2 grandsons, 2, 19) Available Help at Discharge: Family Type of Home: House Home Access: Level entry     Home Layout: Two level;Able to live on main level with bedroom/bathroom Home Equipment: None Additional Comments: no DME at home    Prior Function Level of Independence: Independent with assistive device(s)         Comments: dependent on shopping buggy for support     Hand Dominance   Dominant Hand: Right    Extremity/Trunk Assessment   Upper Extremity Assessment  Upper Extremity Assessment: Overall WFL for tasks assessed;Generalized weakness    Lower Extremity Assessment Lower Extremity Assessment: Generalized weakness;Overall WFL for tasks assessed       Communication      Cognition Arousal/Alertness: Awake/alert Behavior During Therapy: WFL for tasks assessed/performed Overall Cognitive Status: Within  Functional Limits for tasks assessed                                        General Comments      Exercises     Assessment/Plan    PT Assessment Patient needs continued PT services  PT Problem List Decreased mobility;Decreased activity tolerance;Decreased balance;Decreased range of motion;Decreased strength;Decreased knowledge of use of DME;Cardiopulmonary status limiting activity       PT Treatment Interventions DME instruction;Balance training;Gait training;Stair training;Functional mobility training;Therapeutic activities;Therapeutic exercise;Patient/family education    PT Goals (Current goals can be found in the Care Plan section)  Acute Rehab PT Goals Patient Stated Goal: return to home, regain strength PT Goal Formulation: With patient Time For Goal Achievement: 01/26/19 Potential to Achieve Goals: Fair    Frequency Min 2X/week   Barriers to discharge        Co-evaluation               AM-PAC PT "6 Clicks" Mobility  Outcome Measure Help needed turning from your back to your side while in a flat bed without using bedrails?: None Help needed moving from lying on your back to sitting on the side of a flat bed without using bedrails?: None Help needed moving to and from a bed to a chair (including a wheelchair)?: None Help needed standing up from a chair using your arms (e.g., wheelchair or bedside chair)?: A Little Help needed to walk in hospital room?: A Little Help needed climbing 3-5 steps with a railing? : A Lot 6 Click Score: 20    End of Session Equipment Utilized During Treatment: Oxygen Activity Tolerance: Patient limited by fatigue;Patient tolerated treatment well;No increased pain Patient left: in bed;with call bell/phone within reach Nurse Communication: Mobility status PT Visit Diagnosis: Unsteadiness on feet (R26.81);Other abnormalities of gait and mobility (R26.89);Difficulty in walking, not elsewhere classified (R26.2);Muscle  weakness (generalized) (M62.81)    Time: 3299-2426 PT Time Calculation (min) (ACUTE ONLY): 30 min   Charges:   PT Evaluation $PT Eval Moderate Complexity: 1 Mod PT Treatments $Therapeutic Exercise: 8-22 mins        12:34 PM, 01/12/19 Etta Grandchild, PT, DPT Physical Therapist - [facility]  [phone] ([place])    Murlean Seelye C 01/12/2019, 12:32 PM

## 2019-01-12 NOTE — Progress Notes (Signed)
PROGRESS NOTE    Stephanie Mooney  BDZ:[id number] DOB: July 12, 1950 DOA: 01/11/2019 PCP: Gale Journey, MD      Brief Narrative:  Stephanie Mooney is a 68 y.o. F with obesity, HTN.  Asthma/COPD, and hypothyroidism who presented this of breath, fatigue, and generalized malaise.  Urgent care on 12/16, tested positive for Covid.,  Since then she is having increasing shortness of breath and cough until today she came to the emergency room.  In the ER, SPO2 mid 80s.  Chest x-ray showed bibasilar airspace disease.  Respiratory rate in the 20s and 30s.       Assessment & Plan:  Coronavirus pneumonitis with acute hypoxic respiratory failure Patient presented with tachypnea, hypoxia, bilateral pneumonia on chest x-ray in the setting of in the setting of the ongoing 2020 COVID-19 pandemic.  -Continue remdesivir, day 2 of 5 -Continue Decadron, day 2 -VTE PPx with Lovenox -Continue Zinc and Vitamin C     Asymptomatic pyuria Got one dose antibiotics in ER.   -Hold antibiotics -Follow urine culture  Hypertension Blood pressure low -Hold lisinopril, metoprolol  Hyperlipidemia -Continue atorvastatin  Asthma/COPD No active bronchospasm -Continue albuterol 4 times daily needed -Continue home Advair  Obesity -Continue famotidine  Hypothyroidism -Continue levothyroxine  Restless leg syndrome -Continue ropinirole   Hyponatremia Mild, asymptomatic     MDM and disposition: The below labs and imaging reports were reviewed and summarized above.  Medication management as above.   The patient was admitted with COVID-19.  She remains on 1-2L O2, which is new.    Will continue remdesivir and get PT evaluation.  Hopefully, she will improve and be stable for discharge within the next 2-3 days.        DVT prophylaxis: Lovenox Code Status: FULL Family Communication: Daughter by phone     Antimicrobials:   Ceftriaxone x1 on 12/21   Culture data:   12/21 blood  culture x2 -- ng  12/21 urine culture -- ng        Subjective: No fever, vomiting.  Still very weak.  No dyspnea, sputum, but still has cough.  No confusion.  Objective: Vitals:   01/12/19 0823 01/12/19 1010 01/12/19 1020 01/12/19 1135  BP: (!) 96/52 (!) 101/58    Pulse: 64 72 91   Resp: 18     Temp: 97.6 F (36.4 C)     TempSrc: Oral     SpO2: 95% (!) 84% 92% (!) 87%  Weight:      Height:        Intake/Output Summary (Last 24 hours) at 01/12/2019 1558 Last data filed at 01/11/2019 2138 Gross per 24 hour  Intake --  Output 300 ml  Net -300 ml   Filed Weights   01/11/19 1225 01/12/19 0339  Weight: 129.3 kg 134 kg    Examination: General appearance: obese adult female, alert and in no acute distress.   HEENT: Anicteric, conjunctiva pink, lids and lashes normal. No nasal deformity, discharge, epistaxis.  Lips moist.   Skin: Warm and dry.  no jaundice.  No suspicious rashes or lesions. Cardiac: RRR, nl S1-S2, no murmurs appreciated.  Capillary refill is brisk.  JVP not visible.  No LE edema.  Radial pulses 2+ and symmetric. Respiratory: Tachypneic with exertion, normal at rest.  Shallwo, diminished.  No wheezing. Abdomen: Abdomen soft.  no TTP or gaurding. No ascites, distension, hepatosplenomegaly.   MSK: No deformities or effusions. Neuro: Awake and alert.  EOMI, moves all extremities. Speech fluent.  Psych: Sensorium intact and responding to questions, attention normal. Affect normal.  Judgment and insight appear normal.          Data Reviewed: I have personally reviewed following labs and imaging studies:  CBC: Recent Labs  Lab 01/11/19 1238  WBC 4.5  HGB 13.8  HCT 42.5  MCV 84.5  PLT 154   Basic Metabolic Panel: Recent Labs  Lab 01/11/19 1238  NA 133*  K 4.6  CL 94*  CO2 28  GLUCOSE 122*  BUN 19  CREATININE 0.65  CALCIUM 7.9*   GFR: Estimated Creatinine Clearance: 88.7 mL/min (by C-G formula based on SCr of 0.65 mg/dL). Liver  Function Tests: Recent Labs  Lab 01/11/19 1513  AST 39  ALT 27  ALKPHOS 73  BILITOT 0.8  PROT 6.6  ALBUMIN 3.4*   No results for input(s): LIPASE, AMYLASE in the last 168 hours. No results for input(s): AMMONIA in the last 168 hours. Coagulation Profile: No results for input(s): INR, PROTIME in the last 168 hours. Cardiac Enzymes: No results for input(s): CKTOTAL, CKMB, CKMBINDEX, TROPONINI in the last 168 hours. BNP (last 3 results) No results for input(s): PROBNP in the last 8760 hours. HbA1C: No results for input(s): HGBA1C in the last 72 hours. CBG: No results for input(s): GLUCAP in the last 168 hours. Lipid Profile: No results for input(s): CHOL, HDL, LDLCALC, TRIG, CHOLHDL, LDLDIRECT in the last 72 hours. Thyroid Function Tests: No results for input(s): TSH, T4TOTAL, FREET4, T3FREE, THYROIDAB in the last 72 hours. Anemia Panel: No results for input(s): VITAMINB12, FOLATE, FERRITIN, TIBC, IRON, RETICCTPCT in the last 72 hours. Urine analysis:    Component Value Date/Time   COLORURINE AMBER (A) 01/11/2019 1227   APPEARANCEUR CLOUDY (A) 01/11/2019 1227   LABSPEC 1.024 01/11/2019 1227   PHURINE 6.0 01/11/2019 1227   GLUCOSEU NEGATIVE 01/11/2019 1227   HGBUR NEGATIVE 01/11/2019 1227   BILIRUBINUR NEGATIVE 01/11/2019 1227   KETONESUR NEGATIVE 01/11/2019 1227   PROTEINUR 30 (A) 01/11/2019 1227   NITRITE POSITIVE (A) 01/11/2019 1227   LEUKOCYTESUR MODERATE (A) 01/11/2019 1227   Sepsis Labs: @LABRCNTIP (procalcitonin:4,lacticacidven:4)  ) Recent Results (from the past 240 hour(s))  Blood culture (routine x 2)     Status: None (Preliminary result)   Collection Time: 01/11/19  3:35 PM   Specimen: BLOOD  Result Value Ref Range Status   Specimen Description BLOOD RIGHT ANTECUBITAL  Final   Special Requests   Final    BOTTLES DRAWN AEROBIC AND ANAEROBIC Blood Culture adequate volume   Culture   Final    NO GROWTH < 24 HOURS Performed at [facility], [address] [place], [place] [address]    Report Status PENDING  Incomplete  Blood culture (routine x 2)     Status: None (Preliminary result)   Collection Time: 01/11/19  5:38 PM   Specimen: BLOOD  Result Value Ref Range Status   Specimen Description BLOOD BLOOD RIGHT ARM  Final   Special Requests   Final    BOTTLES DRAWN AEROBIC AND ANAEROBIC Blood Culture adequate volume   Culture   Final    NO GROWTH < 24 HOURS Performed at [facility], [address] [place], [place] [address]    Report Status PENDING  Incomplete         Radiology Studies: DG Chest 2 View  Result Date: 01/11/2019 CLINICAL DATA:  Worsening shortness of breath and cough. COVID-19 virus infection. EXAM: CHEST - 2 VIEW COMPARISON:  04/02/2010 FINDINGS:  Heart size is at the upper limits of normal. New multifocal ill-defined areas of airspace opacity are seen in both lungs, suspicious for atypical/viral pneumonia. No evidence of pleural effusion. IMPRESSION: New multifocal areas of airspace opacity, suspicious for atypical/viral pneumonia. Electronically Signed   By: Danae Orleans M.D.   On: 01/11/2019 13:18        Scheduled Meds: . albuterol  1-2 puff Inhalation QID  . vitamin C  500 mg Oral Daily  . atorvastatin  10 mg Oral Daily  . cyclobenzaprine  5 mg Oral QHS  . dexamethasone (DECADRON) injection  6 mg Intravenous Q24H  . docusate sodium  100 mg Oral BID  . enoxaparin (LOVENOX) injection  65 mg Subcutaneous Q24H  . famotidine  20 mg Oral Daily  . folic acid  1 mg Oral Daily  . levothyroxine  125 mcg Oral Q0600  . lisinopril  10 mg Oral Daily  . metoprolol succinate  50 mg Oral Daily  . mometasone-formoterol  2 puff Inhalation BID  . multivitamin with minerals  1 tablet Oral Daily  . rOPINIRole  4 mg Oral QHS  . sodium chloride flush  10-40 mL Intracatheter Q12H  . thiamine  100 mg Oral Daily   Continuous Infusions: . remdesivir 100 mg in NS 100 mL 100 mg (01/12/19 1026)     LOS:  1 day    Time spent: 25 minutes      Alberteen Sam, MD [facility] 01/12/2019, 3:58 PM     Please page through AMION:  [email or web address] Contact charge nurse for password If 7PM-7AM, please contact night-coverage

## 2019-01-13 LAB — COMPREHENSIVE METABOLIC PANEL
ALT: 32 U/L (ref 0–44)
AST: 37 U/L (ref 15–41)
Albumin: 3.1 g/dL — ABNORMAL LOW (ref 3.5–5.0)
Alkaline Phosphatase: 71 U/L (ref 38–126)
Anion gap: 7 (ref 5–15)
BUN: 20 mg/dL (ref 8–23)
CO2: 27 mmol/L (ref 22–32)
Calcium: 7.9 mg/dL — ABNORMAL LOW (ref 8.9–10.3)
Chloride: 101 mmol/L (ref 98–111)
Creatinine, Ser: 0.61 mg/dL (ref 0.44–1.00)
GFR calc Af Amer: >60 mL/min (ref >60)
GFR calc non Af Amer: >60 mL/min (ref >60)
Glucose, Bld: 254 mg/dL — ABNORMAL HIGH (ref 70–99)
Potassium: 5.2 mmol/L — ABNORMAL HIGH (ref 3.5–5.1)
Sodium: 135 mmol/L (ref 135–145)
Total Bilirubin: 0.7 mg/dL (ref 0.3–1.2)
Total Protein: 6.1 g/dL — ABNORMAL LOW (ref 6.5–8.1)

## 2019-01-13 LAB — CBC
HCT: 37.5 % (ref 36.0–46.0)
Hemoglobin: 12.1 g/dL (ref 12.0–15.0)
MCH: 27.1 pg (ref 26.0–34.0)
MCHC: 32.3 g/dL (ref 30.0–36.0)
MCV: 84.1 fL (ref 80.0–100.0)
Platelets: 217 10*3/uL (ref 150–400)
RBC: 4.46 MIL/uL (ref 3.87–5.11)
RDW: 14.4 % (ref 11.5–15.5)
WBC: 7.5 10*3/uL (ref 4.0–10.5)
nRBC: 0 % (ref 0.0–0.2)

## 2019-01-13 LAB — C-REACTIVE PROTEIN: CRP: 1.4 mg/dL — ABNORMAL HIGH (ref <1.0)

## 2019-01-13 NOTE — Plan of Care (Signed)
  Problem: Clinical Measurements: Goal: Respiratory complications will improve Outcome: Progressing   

## 2019-01-13 NOTE — Evaluation (Signed)
Occupational Therapy Evaluation Patient Details Name: Stephanie Mooney MRN: [id number] DOB: [birth year] Today's Date: 01/13/2019    History of Present Illness Emilyn Ruble is a 42yoF who comes to [facility] 12/21 progressive hypoxia, progressive weakness. tested positive for COVID on 16 December. PMH: HTN, migraine, asthma/COPD, hypothyroidism status post thyroidectomy, hyperparathyroidism, osteoarthritis, fibromyalgia history of renal insufficiency and vitamin D deficiency along with chronic pain.   Clinical Impression   Pt seen for OT evaluation this date. Pt was independent in all ADL and short household distance mobility, living in a 2 story home with full flight of stairs to her bedroom and full bath. Pt does not wear O2 at home prior to this admission. Pt reports becoming easily fatigued or out of breath with minimize exertion. Pt currently requires minimal assist for LB ADL and CGA for functional mobility due to poor activity tolerance and cardiopulmonary status. Rest breaks to recover. Pt educated in energy conservation conservation strategies including pursed lip breathing, activity pacing, home/routines modifications, work simplification, AE/DME, prioritizing of meaningful occupations, and falls prevention. Pt verbalized understanding, requiring verbal cues at times to redirect to task, but would benefit from additional skilled OT services to maximize recall and carryover of learned techniques and facilitate implementation of learned techniques into daily routines. Upon discharge, recommend [place] services.      Follow Up Recommendations  Home health OT    Equipment Recommendations  3 in 1 bedside commode;Other (comment)(reacher, LH sponge)    Recommendations for Other Services       Precautions / Restrictions Precautions Precautions: Fall Restrictions Weight Bearing Restrictions: No      Mobility Bed Mobility Overal bed mobility: Modified Independent                 Transfers Overall transfer level: Modified independent Equipment used: Rolling walker (2 wheeled)                  Balance Overall balance assessment: Modified Independent;Mild deficits observed, not formally tested                                         ADL either performed or assessed with clinical judgement   ADL Overall ADL's : Needs assistance/impaired Eating/Feeding: Sitting;Independent Eating/Feeding Details (indicate cue type and reason): pt seated EOB indep with meal Grooming: Sitting;Modified independent   Upper Body Bathing: Sitting;Set up   Lower Body Bathing: Sit to/from stand;Minimal assistance   Upper Body Dressing : Sitting;Set up   Lower Body Dressing: Sit to/from stand;Minimal assistance                       Vision Patient Visual Report: No change from baseline       Perception     Praxis      Pertinent Vitals/Pain Pain Assessment: No/denies pain     Hand Dominance Right   Extremity/Trunk Assessment Upper Extremity Assessment Upper Extremity Assessment: Overall WFL for tasks assessed;Generalized weakness   Lower Extremity Assessment Lower Extremity Assessment: Overall WFL for tasks assessed;Generalized weakness   Cervical / Trunk Assessment Cervical / Trunk Assessment: Normal   Communication Communication Communication: No difficulties   Cognition Arousal/Alertness: Awake/alert Behavior During Therapy: WFL for tasks assessed/performed Overall Cognitive Status: Within Functional Limits for tasks assessed  General Comments       Exercises Other Exercises Other Exercises: Pt instructed in energy conservation strategies to support return to PLOF and problem solving home environment for optimal safety/indep given that pt's bedroom/bathroom is on 2nd floor.   Shoulder Instructions      Home Living Family/patient expects to be discharged to:: Private  residence Living Arrangements: Children;Other relatives(DTR, 2 grandsons, 51, 19) Available Help at Discharge: Family Type of Home: House Home Access: Level entry     Home Layout: Two level;Bed/bath upstairs;1/2 bath on main level Alternate Level Stairs-Number of Steps: 18 steps, full flight   Bathroom Shower/Tub: Chief Strategy Officer: Handicapped height     Home Equipment: None   Additional Comments: no DME at home; bedroom is upstairs, small living room, 1/2 bath on main floor      Prior Functioning/Environment Level of Independence: Independent with assistive device(s)        Comments: dependent on shopping buggy for support        OT Problem List: Decreased strength;Cardiopulmonary status limiting activity;Decreased activity tolerance;Decreased knowledge of use of DME or AE;Obesity      OT Treatment/Interventions: Self-care/ADL training;Therapeutic exercise;Therapeutic activities;Energy conservation;DME and/or AE instruction;Patient/family education    OT Goals(Current goals can be found in the care plan section) Acute Rehab OT Goals Patient Stated Goal: return to home, regain strength OT Goal Formulation: With patient Time For Goal Achievement: 01/27/19 Potential to Achieve Goals: Good ADL Goals Pt Will Perform Lower Body Dressing: with modified independence;with adaptive equipment;sit to/from stand Pt Will Transfer to Toilet: with modified independence;ambulating Additional ADL Goal #1: Pt will verbalize plan to implement at least 3 learned ECS to support maximal safety/independence with ADL tasks  OT Frequency: Min 1X/week   Barriers to D/C:            Co-evaluation              AM-PAC OT "6 Clicks" Daily Activity     Outcome Measure Help from another person eating meals?: None Help from another person taking care of personal grooming?: None Help from another person toileting, which includes using toliet, bedpan, or urinal?: A  Little Help from another person bathing (including washing, rinsing, drying)?: A Little Help from another person to put on and taking off regular upper body clothing?: None Help from another person to put on and taking off regular lower body clothing?: A Little 6 Click Score: 21   End of Session    Activity Tolerance: Patient tolerated treatment well Patient left: in bed;with call bell/phone within reach  OT Visit Diagnosis: Other abnormalities of gait and mobility (R26.89);Muscle weakness (generalized) (M62.81)                Time: 7829-5621 OT Time Calculation (min): 50 min Charges:  OT General Charges $OT Visit: 1 Visit OT Evaluation $OT Eval Low Complexity: 1 Low OT Treatments $Self Care/Home Management : 23-37 mins  Richrd Prime, MPH, MS, OTR/L ascom [phone] 01/13/19, 1:37 PM

## 2019-01-13 NOTE — Progress Notes (Signed)
PROGRESS NOTE    Stephanie Mooney  TIW:[id number] DOB: October 24, 1950 DOA: 01/11/2019 PCP: Gale Journey, MD    Brief Narrative:  Stephanie Mooney is a 68 y.o. Caucasian F with obesity, HTN.  Asthma/COPD, and hypothyroidism who presented this of breath, fatigue, and generalized malaise.  Urgent care on 12/16, tested positive for Covid.,  Since then she is having increasing shortness of breath and cough until today she came to the emergency room.  In the ER, SPO2 mid 80s.  Chest x-ray showed bibasilar airspace disease.  Respiratory rate in the 20s and 30s.    Assessment & Plan:  Coronavirus pneumonitis with acute hypoxic respiratory failure Patient presented with tachypnea, hypoxia, bilateral pneumonia on chest x-ray in the setting of in the setting of the ongoing 2020 COVID-19 pandemic. --Currently on 1L [place] -Continue remdesivir, day 3 of 5 -Continue Decadron, day 3 -VTE PPx with Lovenox -Continue Zinc and Vitamin C  Asymptomatic pyuria Got one dose antibiotics in ER.   -Hold antibiotics -Follow urine culture  Hypertension Blood pressure low -Hold lisinopril, metoprolol  Hyperlipidemia -Continue atorvastatin  Asthma/COPD No active bronchospasm -Continue albuterol 4 times daily needed -Continue home Advair  Obesity -Continue famotidine  Hypothyroidism -Continue levothyroxine  Restless leg syndrome -Continue ropinirole   Hyponatremia Mild, asymptomatic   MDM and disposition: The below labs and imaging reports were reviewed and summarized above.  Medication management as above.   DVT prophylaxis: Lovenox Code Status: FULL Family Communication: not today Disposition: Home with HHPT   Antimicrobials:   Ceftriaxone x1 on 12/21   Culture data:   12/21 blood culture x2 -- ng  12/21 urine culture -- ng    Subjective: Pt's main complaint today was that her meals were cold.  Also said a pill last night made her feel jittery.  Dyspnea improved.  No fever,  chest pain, abdominal pain, N/V/D, dysuria.   Objective: Vitals:   01/12/19 1714 01/12/19 2013 01/13/19 0522 01/13/19 0746  BP: (!) 111/58 (!) 95/47 (!) 105/54 121/74  Pulse: (!) 102 74 69 79  Resp: 18  19 19   Temp: 97.8 F (36.6 C) 98.1 F (36.7 C) 97.6 F (36.4 C) 97.6 F (36.4 C)  TempSrc: Oral Oral Oral Oral  SpO2: 91% 93% 91% 95%  Weight:      Height:        Intake/Output Summary (Last 24 hours) at 01/13/2019 1604 Last data filed at 01/13/2019 0537 Gross per 24 hour  Intake --  Output 1450 ml  Net -1450 ml   Filed Weights   01/11/19 1225 01/12/19 0339  Weight: 129.3 kg 134 kg    Examination: Constitutional: NAD, AAOx3 HEENT: conjunctivae and lids normal, EOMI CV: RRR no M,R,G. Distal pulses +2.  No cyanosis.  RESP: CTA B/L, shallow breaths, normal respiratory effort, on 1L GI: +BS, NTND Extremities: No effusions, edema, or tenderness in BLE SKIN: warm, dry and intact Neuro: II - XII grossly intact.  Sensation intact   Data Reviewed: I have personally reviewed following labs and imaging studies:  CBC: Recent Labs  Lab 01/11/19 1238 01/13/19 0618  WBC 4.5 7.5  HGB 13.8 12.1  HCT 42.5 37.5  MCV 84.5 84.1  PLT 154 250   Basic Metabolic Panel: Recent Labs  Lab 01/11/19 1238 01/13/19 0618  NA 133* 135  K 4.6 5.2*  CL 94* 101  CO2 28 27  GLUCOSE 122* 254*  BUN 19 20  CREATININE 0.65 0.61  CALCIUM 7.9* 7.9*   GFR:  Estimated Creatinine Clearance: 88.7 mL/min (by C-G formula based on SCr of 0.61 mg/dL). Liver Function Tests: Recent Labs  Lab 01/11/19 1513 01/13/19 0618  AST 39 37  ALT 27 32  ALKPHOS 73 71  BILITOT 0.8 0.7  PROT 6.6 6.1*  ALBUMIN 3.4* 3.1*   No results for input(s): LIPASE, AMYLASE in the last 168 hours. No results for input(s): AMMONIA in the last 168 hours. Coagulation Profile: No results for input(s): INR, PROTIME in the last 168 hours. Cardiac Enzymes: No results for input(s): CKTOTAL, CKMB, CKMBINDEX, TROPONINI  in the last 168 hours. BNP (last 3 results) No results for input(s): PROBNP in the last 8760 hours. HbA1C: No results for input(s): HGBA1C in the last 72 hours. CBG: No results for input(s): GLUCAP in the last 168 hours. Lipid Profile: No results for input(s): CHOL, HDL, LDLCALC, TRIG, CHOLHDL, LDLDIRECT in the last 72 hours. Thyroid Function Tests: No results for input(s): TSH, T4TOTAL, FREET4, T3FREE, THYROIDAB in the last 72 hours. Anemia Panel: No results for input(s): VITAMINB12, FOLATE, FERRITIN, TIBC, IRON, RETICCTPCT in the last 72 hours. Urine analysis:    Component Value Date/Time   COLORURINE AMBER (A) 01/11/2019 1227   APPEARANCEUR CLOUDY (A) 01/11/2019 1227   LABSPEC 1.024 01/11/2019 1227   PHURINE 6.0 01/11/2019 1227   GLUCOSEU NEGATIVE 01/11/2019 1227   HGBUR NEGATIVE 01/11/2019 1227   BILIRUBINUR NEGATIVE 01/11/2019 1227   KETONESUR NEGATIVE 01/11/2019 1227   PROTEINUR 30 (A) 01/11/2019 1227   NITRITE POSITIVE (A) 01/11/2019 1227   LEUKOCYTESUR MODERATE (A) 01/11/2019 1227   Sepsis Labs: @LABRCNTIP (procalcitonin:4,lacticacidven:4)  ) Recent Results (from the past 240 hour(s))  Urine culture     Status: Abnormal (Preliminary result)   Collection Time: 01/11/19  3:35 PM   Specimen: Urine, Random  Result Value Ref Range Status   Specimen Description   Final    URINE, RANDOM Performed at [facility], [address] [place], [place] [address]    Special Requests   Final    NONE Performed at [facility], [address] [place], [place] [address]    Culture >=100,000 COLONIES/mL ESCHERICHIA COLI (A)  Final   Report Status PENDING  Incomplete  Blood culture (routine x 2)     Status: None (Preliminary result)   Collection Time: 01/11/19  3:35 PM   Specimen: BLOOD  Result Value Ref Range Status   Specimen Description BLOOD RIGHT ANTECUBITAL  Final   Special Requests   Final    BOTTLES DRAWN AEROBIC AND ANAEROBIC Blood Culture adequate  volume   Culture   Final    NO GROWTH 2 DAYS Performed at [facility], [address] [place], [place] [address]    Report Status PENDING  Incomplete  Blood culture (routine x 2)     Status: None (Preliminary result)   Collection Time: 01/11/19  5:38 PM   Specimen: BLOOD  Result Value Ref Range Status   Specimen Description BLOOD BLOOD RIGHT ARM  Final   Special Requests   Final    BOTTLES DRAWN AEROBIC AND ANAEROBIC Blood Culture adequate volume   Culture   Final    NO GROWTH 2 DAYS Performed at [facility], [address] [place], [place] [address]    Report Status PENDING  Incomplete         Radiology Studies: No results found.      Scheduled Meds: . vitamin C  500 mg Oral Daily  . atorvastatin  10 mg Oral  Daily  . cyclobenzaprine  5 mg Oral QHS  . dexamethasone (DECADRON) injection  6 mg Intravenous Q24H  . docusate sodium  100 mg Oral BID  . enoxaparin (LOVENOX) injection  65 mg Subcutaneous Q24H  . famotidine  20 mg Oral Daily  . feeding supplement (ENSURE ENLIVE)  237 mL Oral BID BM  . folic acid  1 mg Oral Daily  . influenza vaccine adjuvanted  0.5 mL Intramuscular Tomorrow-1000  . levothyroxine  125 mcg Oral Q0600  . mometasone-formoterol  2 puff Inhalation BID  . multivitamin with minerals  1 tablet Oral Daily  . rOPINIRole  4 mg Oral QHS  . sodium chloride flush  10-40 mL Intracatheter Q12H  . thiamine  100 mg Oral Daily   Continuous Infusions: . remdesivir 100 mg in NS 100 mL 100 mg (01/13/19 0939)     LOS: 2 days    Time spent: 25 minutes      Darlin Priestly, MD [facility] 01/13/2019, 4:04 PM     Please page through AMION:  [email or web address] Contact charge nurse for password If 7PM-7AM, please contact night-coverage

## 2019-01-13 NOTE — TOC Initial Note (Signed)
Transition of Care [facility]) - Initial/Assessment Note    Patient Details  Name: Stephanie Mooney MRN: [id number] Date of Birth: 09-Feb-1950  Transition of Care [facility]) CM/SW Contact:    Shelbie Ammons, RN Phone Number: 01/13/2019, 9:24 AM  Clinical Narrative:        RNCM assessment completed via phone due to +Covid diagnosis. Patient lives at home with adult daughter and teenage grandsons, reports good family support in the home. Patient reports her bedroom is on the second floor and she has no plans to sleep downstairs, she reports she is able to get up and down the stairs if need be she can sit and scoot. Discussed PT recommendations for therapy in the home which she verbalizes being agreeable to however she does not have any preferences to who as long as they accept her insurance. Patient denies any other needs and reports that her family can take care of anything else she needs.   Placed call to [facility] with [facility], awaiting call back.    Expected Discharge Plan: [place] Barriers to Discharge: Barriers Resolved   Patient Goals and CMS Choice        Expected Discharge Plan and Services Expected Discharge Plan: [place] In-house Referral: Clinical Social Work Discharge Planning Services: CM Consult Post Acute Care Choice: [place] arrangements for the past 2 months: Single Family Home                                      Prior Living Arrangements/Services Living arrangements for the past 2 months: Single Family Home Lives with:: Adult Children Patient language and need for interpreter reviewed:: Yes Do you feel safe going back to the place where you live?: Yes      Need for Family Participation in Patient Care: Yes (Comment) Care giver support system in place?: Yes (comment)   Criminal Activity/Legal Involvement Pertinent to Current Situation/Hospitalization: No - Comment as needed  Activities of Daily Living       Permission Sought/Granted                  Emotional Assessment Appearance:: (Patient assessed by phone due to +Covid diagnosis) Attitude/Demeanor/Rapport: Engaged   Orientation: : Oriented to Self, Oriented to Place, Oriented to  Time, Oriented to Situation Alcohol / Substance Use: Never Used Psych Involvement: No (comment)  Admission diagnosis:  UTI (urinary tract infection) with pyuria [N39.0] Acute hypoxemic respiratory failure ([place]) [J96.01] Acute hypoxemic respiratory failure due to COVID-19 ([place]) [U07.1, J96.01] COVID-19 [U07.1] Patient Active Problem List   Diagnosis Date Noted  . Acute hypoxemic respiratory failure due to COVID-19 ([place]) 01/11/2019  . COVID-19   . UTI (urinary tract infection) with pyuria   . Essential hypertension    PCP:  Gale Journey, MD Pharmacy:   [id number] [place], [place] [address] Phone: [phone] Fax: [phone]     Social Determinants of Health (SDOH) Interventions    Readmission Risk Interventions No flowsheet data found.

## 2019-01-14 ENCOUNTER — Other Ambulatory Visit: Payer: Self-pay

## 2019-01-14 ENCOUNTER — Encounter: Payer: Self-pay | Admitting: Internal Medicine

## 2019-01-14 LAB — COMPREHENSIVE METABOLIC PANEL
ALT: 32 U/L (ref 0–44)
AST: 35 U/L (ref 15–41)
Albumin: 3 g/dL — ABNORMAL LOW (ref 3.5–5.0)
Alkaline Phosphatase: 71 U/L (ref 38–126)
Anion gap: 11 (ref 5–15)
BUN: 23 mg/dL (ref 8–23)
CO2: 25 mmol/L (ref 22–32)
Calcium: 7.7 mg/dL — ABNORMAL LOW (ref 8.9–10.3)
Chloride: 99 mmol/L (ref 98–111)
Creatinine, Ser: 0.67 mg/dL (ref 0.44–1.00)
GFR calc Af Amer: >60 mL/min (ref >60)
GFR calc non Af Amer: >60 mL/min (ref >60)
Glucose, Bld: 321 mg/dL — ABNORMAL HIGH (ref 70–99)
Potassium: 5.4 mmol/L — ABNORMAL HIGH (ref 3.5–5.1)
Sodium: 135 mmol/L (ref 135–145)
Total Bilirubin: 0.6 mg/dL (ref 0.3–1.2)
Total Protein: 6.1 g/dL — ABNORMAL LOW (ref 6.5–8.1)

## 2019-01-14 LAB — URINE CULTURE: Culture: >=100000 — AB

## 2019-01-14 LAB — CBC
HCT: 37.5 % (ref 36.0–46.0)
Hemoglobin: 12.1 g/dL (ref 12.0–15.0)
MCH: 27.5 pg (ref 26.0–34.0)
MCHC: 32.3 g/dL (ref 30.0–36.0)
MCV: 85.2 fL (ref 80.0–100.0)
Platelets: 240 10*3/uL (ref 150–400)
RBC: 4.4 MIL/uL (ref 3.87–5.11)
RDW: 14.4 % (ref 11.5–15.5)
WBC: 8 10*3/uL (ref 4.0–10.5)
nRBC: 0 % (ref 0.0–0.2)

## 2019-01-14 LAB — C-REACTIVE PROTEIN: CRP: 0.7 mg/dL (ref <1.0)

## 2019-01-14 MED ORDER — KETOROLAC TROMETHAMINE 15 MG/ML IJ SOLN
15.0000 mg | Freq: Once | INTRAMUSCULAR | Status: AC
  Administered 2019-01-14: 15 mg via INTRAVENOUS
  Filled 2019-01-14: qty 1

## 2019-01-14 MED ORDER — PANTOPRAZOLE SODIUM 40 MG PO TBEC
40.0000 mg | DELAYED_RELEASE_TABLET | Freq: Every day | ORAL | Status: DC
  Administered 2019-01-14 – 2019-01-15 (×2): 40 mg via ORAL
  Filled 2019-01-14 (×2): qty 1

## 2019-01-14 MED ORDER — SODIUM POLYSTYRENE SULFONATE 15 GM/60ML PO SUSP
15.0000 g | Freq: Once | ORAL | Status: AC
  Administered 2019-01-14: 15 g via ORAL
  Filled 2019-01-14: qty 60

## 2019-01-14 MED ORDER — ALUM & MAG HYDROXIDE-SIMETH 200-200-20 MG/5ML PO SUSP
15.0000 mL | Freq: Once | ORAL | Status: AC
  Administered 2019-01-14: 15 mL via ORAL
  Filled 2019-01-14: qty 30

## 2019-01-14 NOTE — Progress Notes (Signed)
PROGRESS NOTE    RAJEAN DESANTIAGO  JSE:[id number] DOB: [birth year] DOA: 01/11/2019 PCP: Tiana Loft, MD    Brief Narrative:  Mrs. Lindenberger is a 68 y.o. Caucasian F with obesity, HTN.  Asthma/COPD, and hypothyroidism who presented this of breath, fatigue, and generalized malaise.  Urgent care on 12/16, tested positive for Covid.,  Since then she is having increasing shortness of breath and cough until today she came to the emergency room.  In the ER, SPO2 mid 80s.  Chest x-ray showed bibasilar airspace disease.  Respiratory rate in the 20s and 30s.    Assessment & Plan:  Coronavirus pneumonitis with acute hypoxic respiratory failure Patient presented with tachypnea, hypoxia, bilateral pneumonia on chest x-ray in the setting of in the setting of the ongoing 2020 COVID-19 pandemic. --weaned to RA today PLAN: -Continue remdesivir, day 4 of 5 -Continue Decadron -VTE PPx with Lovenox -Continue Zinc and Vitamin C  Asymptomatic bacteruria  Got one dose antibiotics in ER.   -Hold antibiotics  Hypertension Blood pressure low -Hold lisinopril, metoprolol  Hyperlipidemia -Continue atorvastatin  Asthma/COPD No active bronchospasm -Continue albuterol 4 times daily needed -Continue home Advair  Obesity -Continue famotidine  Hypothyroidism -Continue levothyroxine  Restless leg syndrome -Continue ropinirole   Hyponatremia, mild, resolved Asymptomatic  Hyperkalemia, not POA --unclear etiology.  K+ 4.6 on presentation, incrased to 5.4 this morning. --Kayexalate x1 today.   DVT prophylaxis: Lovenox Code Status: FULL Family Communication: not today Disposition: Home with HHPT tomorrow    Antimicrobials:   Ceftriaxone x1 on 12/21   Culture data:   12/21 blood culture x2 -- ng  12/21 urine culture -- ng    Subjective: Pt reported breathing better.  Had worked with PT in the room without desating, per pt.  No fever, chest pain, abdominal pain,  N/V/D.   Objective: Vitals:   01/13/19 2145 01/14/19 0336 01/14/19 0806 01/14/19 1140  BP: 115/61 108/62 (!) 113/58   Pulse: 77 70 75   Resp: (!) 21 20 19    Temp: 98 F (36.7 C) 98.1 F (36.7 C) 97.9 F (36.6 C)   TempSrc: Oral Oral Oral   SpO2: 95% 92% 95% 90%  Weight:      Height:        Intake/Output Summary (Last 24 hours) at 01/14/2019 1552 Last data filed at 01/14/2019 0338 Gross per 24 hour  Intake 110 ml  Output 1450 ml  Net -1340 ml   Filed Weights   01/11/19 1225 01/12/19 0339  Weight: 129.3 kg 134 kg    Examination: Constitutional: NAD, AAOx3 HEENT: conjunctivae and lids normal, EOMI CV: RRR no M,R,G. Distal pulses +2.  No cyanosis.  RESP: CTA B/L, shallow breaths, normal respiratory effort, on RA GI: +BS, NTND Extremities: No effusions, edema, or tenderness in BLE SKIN: warm, dry and intact Neuro: II - XII grossly intact.  Sensation intact   Data Reviewed: I have personally reviewed following labs and imaging studies:  CBC: Recent Labs  Lab 01/11/19 1238 01/13/19 0618 01/14/19 0351  WBC 4.5 7.5 8.0  HGB 13.8 12.1 12.1  HCT 42.5 37.5 37.5  MCV 84.5 84.1 85.2  PLT 154 217 240   Basic Metabolic Panel: Recent Labs  Lab 01/11/19 1238 01/13/19 0618 01/14/19 0351  NA 133* 135 135  K 4.6 5.2* 5.4*  CL 94* 101 99  CO2 28 27 25   GLUCOSE 122* 254* 321*  BUN 19 20 23   CREATININE 0.65 0.61 0.67  CALCIUM 7.9* 7.9* 7.7*  GFR: Estimated Creatinine Clearance: 88.7 mL/min (by C-G formula based on SCr of 0.67 mg/dL). Liver Function Tests: Recent Labs  Lab 01/11/19 1513 01/13/19 0618 01/14/19 0351  AST 39 37 35  ALT 27 32 32  ALKPHOS 73 71 71  BILITOT 0.8 0.7 0.6  PROT 6.6 6.1* 6.1*  ALBUMIN 3.4* 3.1* 3.0*   No results for input(s): LIPASE, AMYLASE in the last 168 hours. No results for input(s): AMMONIA in the last 168 hours. Coagulation Profile: No results for input(s): INR, PROTIME in the last 168 hours. Cardiac Enzymes: No results  for input(s): CKTOTAL, CKMB, CKMBINDEX, TROPONINI in the last 168 hours. BNP (last 3 results) No results for input(s): PROBNP in the last 8760 hours. HbA1C: No results for input(s): HGBA1C in the last 72 hours. CBG: No results for input(s): GLUCAP in the last 168 hours. Lipid Profile: No results for input(s): CHOL, HDL, LDLCALC, TRIG, CHOLHDL, LDLDIRECT in the last 72 hours. Thyroid Function Tests: No results for input(s): TSH, T4TOTAL, FREET4, T3FREE, THYROIDAB in the last 72 hours. Anemia Panel: No results for input(s): VITAMINB12, FOLATE, FERRITIN, TIBC, IRON, RETICCTPCT in the last 72 hours. Urine analysis:    Component Value Date/Time   COLORURINE AMBER (A) 01/11/2019 1227   APPEARANCEUR CLOUDY (A) 01/11/2019 1227   LABSPEC 1.024 01/11/2019 1227   PHURINE 6.0 01/11/2019 1227   GLUCOSEU NEGATIVE 01/11/2019 1227   HGBUR NEGATIVE 01/11/2019 1227   BILIRUBINUR NEGATIVE 01/11/2019 1227   KETONESUR NEGATIVE 01/11/2019 1227   PROTEINUR 30 (A) 01/11/2019 1227   NITRITE POSITIVE (A) 01/11/2019 1227   LEUKOCYTESUR MODERATE (A) 01/11/2019 1227   Sepsis Labs: @LABRCNTIP (procalcitonin:4,lacticacidven:4)  ) Recent Results (from the past 240 hour(s))  Urine culture     Status: Abnormal   Collection Time: 01/11/19  3:35 PM   Specimen: Urine, Random  Result Value Ref Range Status   Specimen Description   Final    URINE, RANDOM Performed at [facility], [address] [place], [place] [address]    Special Requests   Final    NONE Performed at [facility], [address] [place], [place] [address]    Culture >=100,000 COLONIES/mL ESCHERICHIA COLI (A)  Final   Report Status 01/14/2019 FINAL  Final   Organism ID, Bacteria ESCHERICHIA COLI (A)  Final      Susceptibility   Escherichia coli - MIC*    AMPICILLIN 8 SENSITIVE Sensitive     CEFAZOLIN <=4 SENSITIVE Sensitive     CEFTRIAXONE <=1 SENSITIVE Sensitive     CIPROFLOXACIN <=0.25 SENSITIVE Sensitive      GENTAMICIN <=1 SENSITIVE Sensitive     IMIPENEM <=0.25 SENSITIVE Sensitive     NITROFURANTOIN <=16 SENSITIVE Sensitive     TRIMETH/SULFA <=20 SENSITIVE Sensitive     AMPICILLIN/SULBACTAM 4 SENSITIVE Sensitive     PIP/TAZO <=4 SENSITIVE Sensitive     * >=100,000 COLONIES/mL ESCHERICHIA COLI  Blood culture (routine x 2)     Status: None (Preliminary result)   Collection Time: 01/11/19  3:35 PM   Specimen: BLOOD  Result Value Ref Range Status   Specimen Description BLOOD RIGHT ANTECUBITAL  Final   Special Requests   Final    BOTTLES DRAWN AEROBIC AND ANAEROBIC Blood Culture adequate volume   Culture   Final    NO GROWTH 3 DAYS Performed at [facility], [address] [place], [place] [address]    Report Status PENDING  Incomplete  Blood culture (routine x 2)     Status:  None (Preliminary result)   Collection Time: 01/11/19  5:38 PM   Specimen: BLOOD  Result Value Ref Range Status   Specimen Description BLOOD BLOOD RIGHT ARM  Final   Special Requests   Final    BOTTLES DRAWN AEROBIC AND ANAEROBIC Blood Culture adequate volume   Culture   Final    NO GROWTH 3 DAYS Performed at [facility], [address], [place], [place] [address]    Report Status PENDING  Incomplete         Radiology Studies: No results found.      Scheduled Meds: . vitamin C  500 mg Oral Daily  . atorvastatin  10 mg Oral Daily  . cyclobenzaprine  5 mg Oral QHS  . dexamethasone (DECADRON) injection  6 mg Intravenous Q24H  . docusate sodium  100 mg Oral BID  . enoxaparin (LOVENOX) injection  65 mg Subcutaneous Q24H  . famotidine  20 mg Oral Daily  . feeding supplement (ENSURE ENLIVE)  237 mL Oral BID BM  . folic acid  1 mg Oral Daily  . influenza vaccine adjuvanted  0.5 mL Intramuscular Tomorrow-1000  . levothyroxine  125 mcg Oral Q0600  . mometasone-formoterol  2 puff Inhalation BID  . pantoprazole  40 mg Oral Daily  . rOPINIRole  4 mg Oral QHS  . sodium chloride flush   10-40 mL Intracatheter Q12H  . thiamine  100 mg Oral Daily   Continuous Infusions: . remdesivir 100 mg in NS 100 mL 100 mg (01/14/19 0918)     LOS: 3 days    Time spent: 25 minutes      Enzo Bi, MD [facility] 01/14/2019, 3:52 PM     Please page through [place]:  [email or web address] Contact charge nurse for password If 7PM-7AM, please contact night-coverage

## 2019-01-14 NOTE — Progress Notes (Signed)
Physical Therapy Treatment Patient Details Name: Stephanie Mooney MRN: [id number] DOB: 04-Dec-1950 Today's Date: 01/14/2019    History of Present Illness Stephanie Mooney is a 44yoF who [facility] 12/21 progressive hypoxia, progressive weakness. tested positive for COVID on 16 December. PMH: HTN, migraine, asthma/COPD, hypothyroidism status post thyroidectomy, hyperparathyroidism, osteoarthritis, fibromyalgia history of renal insufficiency and vitamin D deficiency along with chronic pain.    PT Comments    Pt in bed upon arrival, agreeable to PT session. Pt still on 1L/min as 2DA (prior session) but bed mobility and transfers appear more well tolerated, less provoking of SOB, and less labored. Pt able to AMB ~35ft segmented with occasional pauses to recovery SOB, but O2Sats remain 90% or greater. Pt happy to go to recliner at end of session, meal tray presented. RN in room at end of session.      Follow Up Recommendations  Home health PT;Supervision for mobility/OOB;Supervision - Intermittent     Equipment Recommendations  3in1 (PT);Other (comment)    Recommendations for Other Services       Precautions / Restrictions Precautions Precautions: Fall Restrictions Weight Bearing Restrictions: No    Mobility  Bed Mobility Overal bed mobility: Independent                Transfers Overall transfer level: Modified independent Equipment used: Rolling walker (2 wheeled)             General transfer comment: able to perform 5x fromt EOB< hands free, moving well.  Ambulation/Gait Ambulation/Gait assistance: Supervision Gait Distance (Feet): 90 Feet Assistive device: None Gait Pattern/deviations: Step-to pattern     General Gait Details: near baseline mechnic devation, but slower than baseline for pacing. Pt SOB c each 87ft, but recovers standing <60sec. SpO2 90% on 1L/min.   Stairs             Wheelchair Mobility    Modified Rankin (Stroke Patients Only)        Balance Overall balance assessment: Modified Independent;No apparent balance deficits (not formally assessed)                                          Cognition Arousal/Alertness: Awake/alert Behavior During Therapy: WFL for tasks assessed/performed Overall Cognitive Status: Within Functional Limits for tasks assessed                                        Exercises      General Comments        Pertinent Vitals/Pain Pain Assessment: No/denies pain    Home Living                      Prior Function            PT Goals (current goals can now be found in the care plan section) Acute Rehab PT Goals Patient Stated Goal: return to home, regain strength PT Goal Formulation: With patient Time For Goal Achievement: 01/26/19 Potential to Achieve Goals: Fair Progress towards PT goals: Progressing toward goals    Frequency    Min 2X/week      PT Plan Current plan remains appropriate    Co-evaluation              AM-PAC PT "6 Clicks" Mobility  Outcome Measure  Help needed turning from your back to your side while in a flat bed without using bedrails?: None Help needed moving from lying on your back to sitting on the side of a flat bed without using bedrails?: None Help needed moving to and from a bed to a chair (including a wheelchair)?: None Help needed standing up from a chair using your arms (e.g., wheelchair or bedside chair)?: A Little Help needed to walk in hospital room?: A Little Help needed climbing 3-5 steps with a railing? : A Lot 6 Click Score: 20    End of Session Equipment Utilized During Treatment: Oxygen Activity Tolerance: Patient limited by fatigue;Patient tolerated treatment well;No increased pain Patient left: with call bell/phone within reach;in chair;with nursing/sitter in room Nurse Communication: Mobility status PT Visit Diagnosis: Unsteadiness on feet (R26.81);Other abnormalities of  gait and mobility (R26.89);Difficulty in walking, not elsewhere classified (R26.2);Muscle weakness (generalized) (M62.81)     Time: 8828-0034 PT Time Calculation (min) (ACUTE ONLY): 23 min  Charges:  $Therapeutic Exercise: 23-37 mins                     12:42 PM, 01/14/19 Stephanie Mooney, PT, DPT Physical Therapist - [facility]  [phone] (ASCOM)    Stephanie Mooney C 01/14/2019, 12:40 PM

## 2019-01-15 LAB — CBC
HCT: 36 % (ref 36.0–46.0)
Hemoglobin: 11.6 g/dL — ABNORMAL LOW (ref 12.0–15.0)
MCH: 26.9 pg (ref 26.0–34.0)
MCHC: 32.2 g/dL (ref 30.0–36.0)
MCV: 83.3 fL (ref 80.0–100.0)
Platelets: 261 10*3/uL (ref 150–400)
RBC: 4.32 MIL/uL (ref 3.87–5.11)
RDW: 14.6 % (ref 11.5–15.5)
WBC: 7.3 10*3/uL (ref 4.0–10.5)
nRBC: 0 % (ref 0.0–0.2)

## 2019-01-15 LAB — COMPREHENSIVE METABOLIC PANEL
ALT: 38 U/L (ref 0–44)
AST: 36 U/L (ref 15–41)
Albumin: 3 g/dL — ABNORMAL LOW (ref 3.5–5.0)
Alkaline Phosphatase: 66 U/L (ref 38–126)
Anion gap: 10 (ref 5–15)
BUN: 24 mg/dL — ABNORMAL HIGH (ref 8–23)
CO2: 24 mmol/L (ref 22–32)
Calcium: 7.3 mg/dL — ABNORMAL LOW (ref 8.9–10.3)
Chloride: 99 mmol/L (ref 98–111)
Creatinine, Ser: 0.63 mg/dL (ref 0.44–1.00)
GFR calc Af Amer: >60 mL/min (ref >60)
GFR calc non Af Amer: >60 mL/min (ref >60)
Glucose, Bld: 279 mg/dL — ABNORMAL HIGH (ref 70–99)
Potassium: 4.4 mmol/L (ref 3.5–5.1)
Sodium: 133 mmol/L — ABNORMAL LOW (ref 135–145)
Total Bilirubin: 0.5 mg/dL (ref 0.3–1.2)
Total Protein: 5.7 g/dL — ABNORMAL LOW (ref 6.5–8.1)

## 2019-01-15 LAB — C-REACTIVE PROTEIN: CRP: 0.6 mg/dL (ref <1.0)

## 2019-01-15 MED ORDER — DEXAMETHASONE SODIUM PHOSPHATE 10 MG/ML IJ SOLN
6.0000 mg | INTRAMUSCULAR | Status: DC
  Administered 2019-01-15: 6 mg via INTRAVENOUS
  Filled 2019-01-15: qty 0.6

## 2019-01-15 MED ORDER — INFLUENZA VAC A&B SA ADJ QUAD 0.5 ML IM PRSY: 0.5000 mL | PREFILLED_SYRINGE | INTRAMUSCULAR | 0 refills | Status: AC

## 2019-01-15 MED ORDER — LISINOPRIL 10 MG PO TABS: ORAL_TABLET | ORAL | Status: DC

## 2019-01-15 MED ORDER — ONDANSETRON 8 MG PO TBDP: 8.0000 mg | ORAL_TABLET | Freq: Three times a day (TID) | ORAL | 0 refills | Status: AC | PRN

## 2019-01-15 NOTE — Discharge Summary (Signed)
Physician Discharge Summary   Stephanie Mooney  female DOB: 1950-05-03  KDX:[id number]  PCP: Gale Journey, MD  Admit date: 01/11/2019 Discharge date: 01/15/2019  Admitted From: home Disposition:  home Home Health: Yes CODE STATUS: Full code  Discharge Instructions    Diet - low sodium heart healthy   Complete by: As directed    Discharge instructions   Complete by: As directed    Move frequently, but do not over exert yourself.  When you feel short of breath with exertion, then rest and take some deep breaths to recover.  Gradually as you recover from [place], you should be able to do more without getting too short of breath.    I am holding 1 of your blood pressure medication, Lisinopril, because your blood pressure has been low normal in the hospital.  Please see your PCP about whether to restart it.   Dr. Enzo Bi   Increase activity slowly   Complete by: As directed        Hospital Course:  For full details, please see H&P, progress notes, consult notes and ancillary notes.  Briefly,  Stephanie Mooney is a 68 y.o. Caucasian F with obesity, HTN.  Asthma/COPD, and hypothyroidism who presented with dyspnea, fatigue, and generalized malaise.    Coronavirus pneumonitis with acute hypoxic respiratory failure Urgent care on 12/16, tested positive for Covid.  In the ER, SPO2 mid 80s.  She was placed on 2 L nasal cannula oxygen and her sats went up to 92 to 97%.  Chest x-ray showed bibasilar airspace disease.  Respiratory rate in the 20s and 30s.  Pt received 5 days of Remdesivir and steroids, and was weaned to RA prior to discharge.  Asymptomatic bacteruria  Got one dose of ceftriaxone in ER.  Further abx held.  Hypertension Blood pressure low during hospitalization.  Home lisinopril was held until PCP followup.  Home metop was held during hospitalization and resumed after discharge.  Hyperlipidemia Continued atorvastatin  Asthma/COPD, stable No active bronchospasm.   Continued bronchodilators.  Hypothyroidism Continued levothyroxine  Restless leg syndrome Continued ropinirole   Hyponatremia, mild, resolved Asymptomatic  Hyperkalemia, not POA Unclear etiology.  K+ 4.6 on presentation, incrased to 5.4.  Pt received Kayexalate x2 with normalization of K+.   Discharge Diagnoses:  Active Problems:   Acute hypoxemic respiratory failure due to COVID-19 [facility])   Discharge Instructions:  Allergies as of 01/15/2019      Reactions   Nortriptyline Swelling   Pregabalin Swelling, Other (See Comments), Hives   Celecoxib Other (See Comments)   Duloxetine Other (See Comments)   Visual disturbances   Gabapentin    Nsaids    Zolpidem Other (See Comments)   hallucination hallucination hallucination hallucination   Aspirin Nausea Only   Fluticasone-salmeterol Cough   Piroxicam Rash, Other (See Comments)      Medication List    TAKE these medications   Advair HFA 115-21 MCG/ACT inhaler Generic drug: fluticasone-salmeterol Inhale 1 puff into the lungs 2 (two) times daily.   atorvastatin 10 MG tablet Commonly known as: LIPITOR Take 10 mg by mouth daily.   cyclobenzaprine 5 MG tablet Commonly known as: FLEXERIL Take 5 mg by mouth at bedtime.   Dexilant 30 MG capsule Generic drug: Dexlansoprazole Take 1 capsule by mouth daily.   levothyroxine 125 MCG tablet Commonly known as: SYNTHROID Take 125 mcg by mouth daily.   lisinopril 10 MG tablet Commonly known as: ZESTRIL Hold this until your outpatient PCP follow up,  because your blood pressure was low normal. What changed:   how much to take  how to take this  when to take this  additional instructions   metoprolol succinate 50 MG 24 hr tablet Commonly known as: TOPROL-XL Take 50 mg by mouth daily.   ondansetron 8 MG disintegrating tablet Commonly known as: ZOFRAN-ODT Take 1 tablet (8 mg total) by mouth 3 (three) times daily as needed for nausea or vomiting. What changed:    when to take this  reasons to take this   rOPINIRole 2 MG tablet Commonly known as: REQUIP Take 4 mg by mouth at bedtime.     ASK your doctor about these medications   influenza vaccine adjuvanted 0.5 ML injection Commonly known as: FLUAD Inject 0.5 mLs into the muscle tomorrow at 10 am for 1 dose. Ask about: Should I take this medication?       Follow-up Information    Tiana Loft, MD Follow up in 1 week(s).   Specialty: Pediatrics Contact information: [address] [place] [address] [phone]           Allergies  Allergen Reactions  . Nortriptyline Swelling  . Pregabalin Swelling, Other (See Comments) and Hives  . Celecoxib Other (See Comments)  . Duloxetine Other (See Comments)    Visual disturbances   . Gabapentin   . Nsaids   . Zolpidem Other (See Comments)    hallucination hallucination hallucination hallucination   . Aspirin Nausea Only  . Fluticasone-Salmeterol Cough  . Piroxicam Rash and Other (See Comments)     The results of significant diagnostics from this hospitalization (including imaging, microbiology, ancillary and laboratory) are listed below for reference.   Consultations:   Procedures/Studies: DG Chest 2 View  Result Date: 01/11/2019 CLINICAL DATA:  Worsening shortness of breath and cough. COVID-19 virus infection. EXAM: CHEST - 2 VIEW COMPARISON:  04/02/2010 FINDINGS: Heart size is at the upper limits of normal. New multifocal ill-defined areas of airspace opacity are seen in both lungs, suspicious for atypical/viral pneumonia. No evidence of pleural effusion. IMPRESSION: New multifocal areas of airspace opacity, suspicious for atypical/viral pneumonia. Electronically Signed   By: Danae Orleans M.D.   On: 01/11/2019 13:18      Labs: BNP (last 3 results) No results for input(s): BNP in the last 8760 hours. Basic Metabolic Panel: Recent Labs  Lab 01/13/19 0618 01/14/19 0351 01/15/19 0658  NA 135 135  133*  K 5.2* 5.4* 4.4  CL 101 99 99  CO2 GLUCOSE 254* 321* 279*  BUN 20 23 24*  CREATININE 0.61 0.67 0.63  CALCIUM 7.9* 7.7* 7.3*   Liver Function Tests: Recent Labs  Lab 01/13/19 0618 01/14/19 0351 01/15/19 0658  AST 37 35 36  ALT 32 32 38  ALKPHOS 71 71 66  BILITOT 0.7 0.6 0.5  PROT 6.1* 6.1* 5.7*  ALBUMIN 3.1* 3.0* 3.0*   No results for input(s): LIPASE, AMYLASE in the last 168 hours. No results for input(s): AMMONIA in the last 168 hours. CBC: Recent Labs  Lab 01/13/19 0618 01/14/19 0351 01/15/19 0658  WBC 7.5 8.0 7.3  HGB 12.1 12.1 11.6*  HCT 37.5 37.5 36.0  MCV 84.1 85.2 83.3  PLT 217 240 261   Cardiac Enzymes: No results for input(s): CKTOTAL, CKMB, CKMBINDEX, TROPONINI in the last 168 hours. BNP: Invalid input(s): POCBNP CBG: No results for input(s): GLUCAP in the last 168 hours. D-Dimer No results for input(s): DDIMER in the last 72 hours.  Hgb A1c No results for input(s): HGBA1C in the last 72 hours. Lipid Profile No results for input(s): CHOL, HDL, LDLCALC, TRIG, CHOLHDL, LDLDIRECT in the last 72 hours. Thyroid function studies No results for input(s): TSH, T4TOTAL, T3FREE, THYROIDAB in the last 72 hours.  Invalid input(s): FREET3 Anemia work up No results for input(s): VITAMINB12, FOLATE, FERRITIN, TIBC, IRON, RETICCTPCT in the last 72 hours. Urinalysis    Component Value Date/Time   COLORURINE AMBER (A) 01/11/2019 1227   APPEARANCEUR CLOUDY (A) 01/11/2019 1227   LABSPEC 1.024 01/11/2019 1227   PHURINE 6.0 01/11/2019 1227   GLUCOSEU NEGATIVE 01/11/2019 1227   HGBUR NEGATIVE 01/11/2019 1227   BILIRUBINUR NEGATIVE 01/11/2019 1227   KETONESUR NEGATIVE 01/11/2019 1227   PROTEINUR 30 (A) 01/11/2019 1227   NITRITE POSITIVE (A) 01/11/2019 1227   LEUKOCYTESUR MODERATE (A) 01/11/2019 1227   Sepsis Labs Invalid input(s): PROCALCITONIN,  WBC,  LACTICIDVEN Microbiology Recent Results (from the past 240 hour(s))  Urine culture      Status: Abnormal   Collection Time: 01/11/19  3:35 PM   Specimen: Urine, Random  Result Value Ref Range Status   Specimen Description   Final    URINE, RANDOM Performed at [facility], [address] [place], [place] [address]    Special Requests   Final    NONE Performed at [facility], [address], [place], [place] [address]    Culture >=100,000 COLONIES/mL ESCHERICHIA COLI (A)  Final   Report Status 01/14/2019 FINAL  Final   Organism ID, Bacteria ESCHERICHIA COLI (A)  Final      Susceptibility   Escherichia coli - MIC*    AMPICILLIN 8 SENSITIVE Sensitive     CEFAZOLIN <=4 SENSITIVE Sensitive     CEFTRIAXONE <=1 SENSITIVE Sensitive     CIPROFLOXACIN <=0.25 SENSITIVE Sensitive     GENTAMICIN <=1 SENSITIVE Sensitive     IMIPENEM <=0.25 SENSITIVE Sensitive     NITROFURANTOIN <=16 SENSITIVE Sensitive     TRIMETH/SULFA <=20 SENSITIVE Sensitive     AMPICILLIN/SULBACTAM 4 SENSITIVE Sensitive     PIP/TAZO <=4 SENSITIVE Sensitive     * >=100,000 COLONIES/mL ESCHERICHIA COLI  Blood culture (routine x 2)     Status: None   Collection Time: 01/11/19  3:35 PM   Specimen: BLOOD  Result Value Ref Range Status   Specimen Description BLOOD RIGHT ANTECUBITAL  Final   Special Requests   Final    BOTTLES DRAWN AEROBIC AND ANAEROBIC Blood Culture adequate volume   Culture   Final    NO GROWTH 5 DAYS Performed at [facility], [address] [place], [place] [address]    Report Status 01/16/2019 FINAL  Final  Blood culture (routine x 2)     Status: None   Collection Time: 01/11/19  5:38 PM   Specimen: BLOOD  Result Value Ref Range Status   Specimen Description BLOOD BLOOD RIGHT ARM  Final   Special Requests   Final    BOTTLES DRAWN AEROBIC AND ANAEROBIC Blood Culture adequate volume   Culture   Final    NO GROWTH 5 DAYS Performed at [facility], [address] [place], [place] [address]    Report Status 01/16/2019 FINAL  Final      Total time spend on discharging this patient, including the last patient exam, discussing the hospital stay, instructions for ongoing care as it relates to all pertinent caregivers, as well as preparing the medical discharge records, prescriptions, and/or referrals as applicable,  is 35 minutes.    Darlin Priestlyina Frances Joynt, MD  [facility] 01/18/2019, 6:17 PM  If 7PM-7AM, please contact night-coverage

## 2019-01-15 NOTE — Progress Notes (Signed)
Went over discharge instructions with the patient including medications and follow-up appointment. Discontinue PIV, no telemetry monitor. Patient's ride is here to pick her up.

## 2019-01-16 LAB — CULTURE, BLOOD (ROUTINE X 2)
Culture: NO GROWTH
Culture: NO GROWTH
Special Requests: ADEQUATE
Special Requests: ADEQUATE

## 2019-02-08 ENCOUNTER — Other Ambulatory Visit: Payer: Self-pay

## 2019-09-23 DIAGNOSIS — G8929 Other chronic pain: Secondary | ICD-10-CM | POA: Insufficient documentation

## 2020-09-12 DIAGNOSIS — I5032 Chronic diastolic (congestive) heart failure: Secondary | ICD-10-CM | POA: Insufficient documentation

## 2021-08-21 DIAGNOSIS — I4819 Other persistent atrial fibrillation: Secondary | ICD-10-CM | POA: Insufficient documentation

## 2021-10-01 ENCOUNTER — Emergency Department: Payer: Medicare HMO

## 2021-10-01 ENCOUNTER — Emergency Department
Admission: EM | Admit: 2021-10-01 | Discharge: 2021-10-02 | Disposition: A | Discharge status: Other Acute Inpatient Hospital | Payer: Medicare HMO | Attending: Emergency Medicine | Admitting: Emergency Medicine

## 2021-10-01 DIAGNOSIS — R0789 Other chest pain: Secondary | ICD-10-CM | POA: Diagnosis present

## 2021-10-01 DIAGNOSIS — Z8679 Personal history of other diseases of the circulatory system: Secondary | ICD-10-CM | POA: Insufficient documentation

## 2021-10-01 DIAGNOSIS — I509 Heart failure, unspecified: Secondary | ICD-10-CM | POA: Diagnosis not present

## 2021-10-01 DIAGNOSIS — R778 Other specified abnormalities of plasma proteins: Secondary | ICD-10-CM | POA: Diagnosis not present

## 2021-10-01 DIAGNOSIS — R0781 Pleurodynia: Secondary | ICD-10-CM

## 2021-10-01 HISTORY — PX: ATRIAL FIBRILLATION ABLATION: EP1191

## 2021-10-01 LAB — CBC WITH DIFFERENTIAL/PLATELET
Abs Immature Granulocytes: 0.02 10*3/uL (ref 0.00–0.07)
Basophils Absolute: 0 10*3/uL (ref 0.0–0.1)
Basophils Relative: 1 %
Eosinophils Absolute: 0.1 10*3/uL (ref 0.0–0.5)
Eosinophils Relative: 2 %
HCT: 39.6 % (ref 36.0–46.0)
Hemoglobin: 12.7 g/dL (ref 12.0–15.0)
Immature Granulocytes: 0 %
Lymphocytes Relative: 18 %
Lymphs Abs: 1.3 10*3/uL (ref 0.7–4.0)
MCH: 28.9 pg (ref 26.0–34.0)
MCHC: 32.1 g/dL (ref 30.0–36.0)
MCV: 90.2 fL (ref 80.0–100.0)
Monocytes Absolute: 0.5 10*3/uL (ref 0.1–1.0)
Monocytes Relative: 7 %
Neutro Abs: 5.2 10*3/uL (ref 1.7–7.7)
Neutrophils Relative %: 72 %
Platelets: 155 10*3/uL (ref 150–400)
RBC: 4.39 MIL/uL (ref 3.87–5.11)
RDW: 14.4 % (ref 11.5–15.5)
WBC: 7.1 10*3/uL (ref 4.0–10.5)
nRBC: 0 % (ref 0.0–0.2)

## 2021-10-01 MED ORDER — HYDROMORPHONE HCL 1 MG/ML IJ SOLN
1.0000 mg | INTRAMUSCULAR | Status: AC
  Administered 2021-10-01: 1 mg via INTRAVENOUS
  Filled 2021-10-01: qty 1

## 2021-10-01 MED ORDER — MORPHINE SULFATE (PF) 4 MG/ML IV SOLN
4.0000 mg | Freq: Once | INTRAVENOUS | Status: AC
  Administered 2021-10-01: 4 mg via INTRAVENOUS
  Filled 2021-10-01: qty 1

## 2021-10-01 NOTE — ED Provider Notes (Signed)
[facility] Provider Note    Event Date/Time   First MD Initiated Contact with Patient 10/01/21 2252     (approximate)   History   Chest Pain   HPI  Stephanie Mooney is a 71 y.o. female whose medical history includes but is not limited to persistent atrial fibrillation for which she had a cardiac ablation today at [facility] by Dr. Herbert Deaner.  History also includes morbid obesity, chronic heart failure with preserved ejection fraction, chronic pain disorders with a pain medication agreement at the [facility] pain management center, prolonged QTc interval on EKG, restless leg syndrome,, etc.  The patient arrives by EMS for evaluation of acute onset chest pain.  She reports that she had her ablation earlier today and after she was extubated and recovered she was sent home.  There were no known complications.  Around 6 PM (approximately 5 hours ago) she started to feel some chest discomfort and has steadily gotten worse since that time until she could not stand it.  It seems to come in waves every few seconds with sharp stabbing pains.  The pain is exacerbated by taking a deep breath.  It is making her feel short of breath but the primary issue is the pain, not shortness of breath.  She has no numbness nor tingling in her extremities and no extremity weakness.  The pain is severe and it is causing her to cry out.  Reportedly when the family called the [facility] number for the cardiology team, it was recommended to them that she go to the nearest emergency department.  Paramedics report that they asked her if she wanted to go to [facility] but she said she "did not think I could make it".     Physical Exam   Triage Vital Signs: ED Triage Vitals  Enc Vitals Group     BP 10/01/21 2305 112/86     Pulse Rate 10/01/21 2305 92     Resp 10/01/21 2305 20     Temp 10/01/21 2305 98.3 F (36.8 C)     Temp src --      SpO2 10/01/21 2305 100 %     Weight 10/01/21 2301 133.8 kg (295 lb)     Height 10/01/21  2301 1.549 m (5\' 1" )     Head Circumference --      Peak Flow --      Pain Score 10/01/21 2301 10     Pain Loc --      Pain Edu? --      Excl. in GC? --     Most recent vital signs: Vitals:   10/02/21 0418 10/02/21 0429  BP: 99/61 99/61  Pulse: 88 88  Resp: (!) 21 (!) 21  Temp: 98.3 F (36.8 C) 98.3 F (36.8 C)  SpO2: 99% 99%     General: Awake, appears very uncomfortable, clutching at her chest.  Oriented, answering questions appropriately. CV:  Good peripheral perfusion.  Normal heart sounds.  Regular rate and rhythm upon auscultation.  I have visualized the right femoral access site for the ablation that occurred earlier today and I see no evidence of hematoma, active bleeding, induration, fluctuance, or other immediate postoperative complication. Resp:  Forced breathing effort apparently due to pleuritic chest pain.  However lungs are clear to auscultation bilaterally in spite of relatively shallow breaths and decreased effort due to pain.  Speaking in short phrases secondary to intermittent stabbing chest pain. Abd:  Morbid obesity.  No distention.  No tenderness to palpation of the abdomen.   ED Results / Procedures / Treatments   Labs (all labs ordered are listed, but only abnormal results are displayed) Labs Reviewed  COMPREHENSIVE METABOLIC PANEL - Abnormal; Notable for the following components:      Result Value   Potassium 3.2 (*)    Glucose, Bld 139 (*)    Calcium 7.8 (*)    AST 44 (*)    All other components within normal limits  BRAIN NATRIURETIC PEPTIDE - Abnormal; Notable for the following components:   B Natriuretic Peptide 112.9 (*)    All other components within normal limits  PROTIME-INR - Abnormal; Notable for the following components:   Prothrombin Time 17.7 (*)    INR 1.5 (*)    All other components within normal limits  TROPONIN I (HIGH SENSITIVITY) - Abnormal; Notable for the following components:   Troponin I (High Sensitivity) 2,540 (*)     All other components within normal limits  TROPONIN I (HIGH SENSITIVITY) - Abnormal; Notable for the following components:   Troponin I (High Sensitivity) 2,701 (*)    All other components within normal limits  LIPASE, BLOOD  CBC WITH DIFFERENTIAL/PLATELET  MAGNESIUM     EKG  ED ECG REPORT I, Loleta Roseory Tishawna Larouche, the attending physician, personally viewed and interpreted this ECG.  Date: 10/01/2021 EKG Time: 23: 07 Rate: 93 Rhythm: Probable sinus rhythm, somewhat difficult to interpret given artifact QRS Axis: normal Intervals: Prolonged QTc of 517 ms ST/T Wave abnormalities: Non-specific ST segment / T-wave changes, but no clear evidence of acute ischemia. Narrative Interpretation: Somewhat difficult to appreciate sinus rhythm versus A-fib due to artifact.  However there is no evidence of ischemia nor heart block.  Prolonged QTc.   ED ECG REPORT I, Loleta Roseory Margie Urbanowicz, the attending physician, personally viewed and interpreted this ECG.  Date: 10/02/2021 EKG Time: 03:09:00 Rate: 89 Rhythm: sinus rhythm with premature atrial complexes QRS Axis: normal Intervals: Prolonged QTc at 512 ms ST/T Wave abnormalities: Non-specific ST segment / T-wave changes, but no clear evidence of acute ischemia. Narrative Interpretation: no definitive evidence of acute ischemia; does not meet STEMI criteria.   ED ECG REPORT I, Loleta Roseory Erendida Wrenn, the attending physician, personally viewed and interpreted this ECG.  Date: 10/02/2021 EKG Time: 03:09:53 Rate: 87 Rhythm: Sinus arrhythmia QRS Axis: normal Intervals: QTc prolongation of 550 ms ST/T Wave abnormalities: Non-specific ST segment / T-wave changes, but no clear evidence of acute ischemia. Narrative Interpretation: no definitive evidence of acute ischemia; does not meet STEMI criteria.   RADIOLOGY See hospital course for details: Questionable pulmonary vascular congestion on chest x-ray, some patchy atelectasis on CT chest without  contrast.    PROCEDURES:  Critical Care performed: Yes, see critical care procedure note(s)  .1-3 Lead EKG Interpretation  Performed by: Loleta RoseForbach, Nile Prisk, MD Authorized by: Loleta RoseForbach, Caid Radin, MD     Interpretation: normal     ECG rate:  65   ECG rate assessment: normal     Rhythm: sinus rhythm     Ectopy: none     Conduction: normal   Ultrasound ED Echo  Date/Time: 10/02/2021 12:40 AM  Performed by: Loleta RoseForbach, Leroi Haque, MD Authorized by: Loleta RoseForbach, Tonianne Fine, MD   Procedure details:    Indications: chest pain     Views: subxiphoid, parasternal long axis view and parasternal short axis view     Images: not archived     Limitations:  Body habitus Findings:    Pericardium: no pericardial effusion   Impression:  Impression: normal   .Critical Care  Performed by: Loleta Rose, MD Authorized by: Loleta Rose, MD   Critical care provider statement:    Critical care time (minutes):  45   Critical care time was exclusive of:  Separately billable procedures and treating other patients   Critical care was necessary to treat or prevent imminent or life-threatening deterioration of the following conditions:  Cardiac failure   Critical care was time spent personally by me on the following activities:  Development of treatment plan with patient or surrogate, evaluation of patient's response to treatment, examination of patient, obtaining history from patient or surrogate, ordering and performing treatments and interventions, ordering and review of laboratory studies, ordering and review of radiographic studies, pulse oximetry, re-evaluation of patient's condition and review of old charts    MEDICATIONS ORDERED IN ED: Medications  morphine (PF) 4 MG/ML injection 4 mg (4 mg Intravenous Given 10/01/21 2321)  HYDROmorphone (DILAUDID) injection 1 mg (1 mg Intravenous Given 10/01/21 2355)  morphine (PF) 4 MG/ML injection 4 mg (4 mg Intravenous Given 10/02/21 0115)  iohexol (OMNIPAQUE) 350 MG/ML injection  100 mL (100 mLs Intravenous Contrast Given 10/02/21 0214)  ketorolac (TORADOL) 30 MG/ML injection 15 mg (15 mg Intravenous Given 10/02/21 0233)  morphine (PF) 4 MG/ML injection 4 mg (4 mg Intravenous Given 10/02/21 0325)     IMPRESSION / MDM / ASSESSMENT AND PLAN / ED COURSE  I reviewed the triage vital signs and the nursing notes.                              Differential diagnosis includes, but is not limited to, aortic or other vascular dissection, ACS, pericarditis, pneumothorax, healthcare associated pneumonia or other complication from intubation, other cardiac muscle damage secondary to ablation, electrolyte or metabolic abnormality, acute exacerbation of chronic pain syndrome.  Patient's presentation is most consistent with acute presentation with potential threat to life or bodily function.  Patient's vital signs are stable and within normal limits; heart rate is slightly above 90 but is generally appropriate.  She is not in respiratory distress although she has pain with respirations.  Labs/studies ordered: EKG, portable chest x-ray, peripheral IV, CMP, CBC with differential, high-sensitivity troponin, magnesium, pro time-INR, BNP, lipase.  Medications ordered:  morphine 4 mg IV.  Patient has prolonged QTc, holding off on prophylactic Zofran unless patient develops nausea/vomiting.  I reviewed the medical record including notes in care everywhere.  The procedure note from the ablation today seems to be in process but I verify the right femoral access for cardiac ablation.  No obvious complications were listed.  I have verified the patient's medication list and it appears that she is on Eliquis.  Her current presentation does not appear consistent with pneumothorax nor PE, and she has a well-documented preserved ejection fraction; the presentation is not consistent with volume overload/pulmonary edema, although she could have a lung injury after her procedure and intubation/extubation  today.  I will reassess after analgesia has been administered and wants labs and imaging started coming back.  The patient is on the cardiac monitor to evaluate for evidence of arrhythmia and/or significant heart rate changes.   Clinical Course as of 10/02/21 0824  Tue Oct 02, 2021  0040 I personally performed a bedside cardiac ultrasound.  I was able to get multiple good views of her heart including parasternal long, parasternal short, and subxiphoid.  In each case I was able to visualize  good cardiac squeeze and I cannot see any evidence of pericardial effusion. [CF]  [phone] Patient's labs are all generally reassuring except for a troponin of 2540 which is almost certainly due to her cardiac ablation today.  Cannot rule out acute aortic syndrome at this time but her presentation is more consistent with pericarditis or possible AAS.  Even if the risk is low, the possibility of aortic/vascular injury as result of her invasive procedure today and the resulting morbidity/mortality is such that I feel it is appropriate to rule out with CTA chest/abdomen/pelvis.  Once AAS has been ruled out, we can discuss additional interventions and possible transfer back to [facility], but I feel it is incumbent upon me to first rule out a potentially life-threatening issue that might require emergent vascular intervention.  I explained this to the patient and her daughter, who is now at bedside, and they understand and agree. [CF]  0110 Peripheral IV access sufficient for CTA is difficult to obtain.  IV team has been consulted.  However, given the patient's recent cardiac ablation, and the very strong preference now by the patient and by the patient's daughter that she continue her care at [facility], I am contacting the [facility] logistics center to discuss transfer given her acute chest pain and elevated troponin in the setting of recent cardiac ablation. [CF]  0121 Discussed case by phone with the [facility] logistics center representative.  She  will contact cardiology and get in touch with me. [CF]  0143 I consulted by phone with Dr. Rachael Fee, [facility] Cardiology.  We discussed the case in detail including lab results, bedside ultrasound result, etc.  She felt that this is most likely pericarditis and that the patient can be managed as an outpatient with colchicine and Toradol while she is in the emergency department.  I expressed my concern about the severity of the chest pain, the patient's age and comorbidities, the fact that she just recently had a cardiac ablation, her troponin of 2500, etc, and that I feel it is perhaps premature to write off the possibility of a more emergent issue and that the patient would benefit from chest pain observation.  She suggested we get a repeat troponin to see if the patient's troponin is going up significantly which would indicate more of an ongoing myocardial damage as opposed to simply an elevated troponin in the setting of the recent ablation.  I think that is reasonable and we will proceed with a repeat troponin.  Unfortunately the IV started by the IV team was not adequate for the CTA.  I spoke directly with the IV team nurse and he said that he has no other options and he cannot get vascular access for a CTA.  However after we discussed that he is going to try again and see if he can get a more proximal IV sufficient for CTA. [CF]  0156 In the meantime we need to send a second troponin but it has not yet been 2 hours since the initial blood draw; second troponin will be sent at 2:30 AM [CF]  0157 I rechecked the patient event log and it appears that the blood was actually drawn at approximately 2345, so it has been 2 hours.  I will ask the nurse to send a repeat troponin while they are looking for the additional vascular access. [CF]  0222 Another IV was obtained but I talked to the CT technologist and apparently the IV blew as soon as they tried to push the  contrast.  Patient is coming back to the room.  [CF]  0304 Troponin I (High Sensitivity)(!!): 2,701 Repeat troponin is gone up about 200 points to 2700.  Patient still has ongoing pain, Toradol 15 mg did not help. [CF]  [phone] Patient reports no improvement after Toradol, I am ordering another morphine 4 mg IV. [CF]  0318 CT CHEST WO CONTRAST I viewed and interpreted the patient's CT chest without contrast, which is all that CT could get given that the larger bore PIV did not work.  I see no evidence of acute abnormality.  Radiologist commented that there is some patchy opacities in the lungs which are likely atelectasis. [CF]  [id number] I spoke again by phone with Dr. Paulino Rily with [facility] cardiology.  We discussed the case including the results, the rising troponin, the ongoing pain in spite of Toradol and multiple rounds of narcotics, etc.  She agrees at this point that transferring the patient to [facility] makes the most sense.  Since they have no beds available she recommends ED to ED transfer which I think is very appropriate.  [facility] logistics center tells me that the protocol is for Dr. Paulino Rily to speak with the ED attending, and if the ED attending agrees, they will call me back.  I expressed my surprise because typically I would speak directly with the ED attending and they would not have the opportunity to simply reject the patient before speaking with me, but the logistics center assures me that this is the long time protocol.  I will await a call back.    Of note, I asked about starting heparin for presumed NSTEMI versus holding off until the patient gets to [facility] given that she is already anticoagulated on Eliquis. Dr. Paulino Rily recommended holding off on heparin which I think is appropriate under the circumstances. [CF]  0407 Discussed case again with [facility] logistics.  They were able to accept the patient directly to the cardiology service under the attending Dr. Vickey Huger.  We are now awaiting transport.  I updated the patient who said that she was able to fall asleep  for a little bit but still feels the ongoing chest pain.  She is hemodynamically stable and appropriate for BLS transport. [CF]    Clinical Course User Index [CF] Loleta Rose, MD     FINAL CLINICAL IMPRESSION(S) / ED DIAGNOSES   Final diagnoses:  Elevated troponin level  Status post ablation of atrial fibrillation  Pleuritic chest pain     Rx / DC Orders   ED Discharge Orders     None        Note:  This document was prepared using Dragon voice recognition software and may include unintentional dictation errors.   Loleta Rose, MD 10/02/21 [phone]

## 2021-10-01 NOTE — ED Triage Notes (Signed)
Pt comes from home via [facility] c/o of chest pain. Pt states she had an ablation done today at [facility]. Pt was fine then pain got progressively worse. Pt states pain feel like someone punching in the middle of her chest. Hx of afib

## 2021-10-02 ENCOUNTER — Emergency Department: Payer: Medicare HMO

## 2021-10-02 ENCOUNTER — Encounter: Payer: Self-pay | Admitting: Radiology

## 2021-10-02 LAB — COMPREHENSIVE METABOLIC PANEL
ALT: 19 U/L (ref 0–44)
AST: 44 U/L — ABNORMAL HIGH (ref 15–41)
Albumin: 3.9 g/dL (ref 3.5–5.0)
Alkaline Phosphatase: 81 U/L (ref 38–126)
Anion gap: 10 (ref 5–15)
BUN: 22 mg/dL (ref 8–23)
CO2: 27 mmol/L (ref 22–32)
Calcium: 7.8 mg/dL — ABNORMAL LOW (ref 8.9–10.3)
Chloride: 104 mmol/L (ref 98–111)
Creatinine, Ser: 0.75 mg/dL (ref 0.44–1.00)
GFR, Estimated: >60 mL/min (ref >60)
Glucose, Bld: 139 mg/dL — ABNORMAL HIGH (ref 70–99)
Potassium: 3.2 mmol/L — ABNORMAL LOW (ref 3.5–5.1)
Sodium: 141 mmol/L (ref 135–145)
Total Bilirubin: 1 mg/dL (ref 0.3–1.2)
Total Protein: 6.9 g/dL (ref 6.5–8.1)

## 2021-10-02 LAB — BRAIN NATRIURETIC PEPTIDE: B Natriuretic Peptide: 112.9 pg/mL — ABNORMAL HIGH (ref 0.0–100.0)

## 2021-10-02 LAB — PROTIME-INR
INR: 1.5 — ABNORMAL HIGH (ref 0.8–1.2)
Prothrombin Time: 17.7 seconds — ABNORMAL HIGH (ref 11.4–15.2)

## 2021-10-02 LAB — TROPONIN I (HIGH SENSITIVITY)
Troponin I (High Sensitivity): 2540 ng/L (ref <18)
Troponin I (High Sensitivity): 2701 ng/L (ref <18)

## 2021-10-02 LAB — MAGNESIUM: Magnesium: 1.7 mg/dL (ref 1.7–2.4)

## 2021-10-02 LAB — LIPASE, BLOOD: Lipase: 25 U/L (ref 11–51)

## 2021-10-02 MED ORDER — IOHEXOL 350 MG/ML SOLN
100.0000 mL | Freq: Once | INTRAVENOUS | Status: AC | PRN
  Administered 2021-10-02: 100 mL via INTRAVENOUS

## 2021-10-02 MED ORDER — MORPHINE SULFATE (PF) 4 MG/ML IV SOLN
4.0000 mg | Freq: Once | INTRAVENOUS | Status: AC
  Administered 2021-10-02: 4 mg via INTRAVENOUS
  Filled 2021-10-02: qty 1

## 2021-10-02 MED ORDER — COLCHICINE 0.6 MG PO TABS
0.6000 mg | ORAL_TABLET | ORAL | Status: DC
  Filled 2021-10-02: qty 1

## 2021-10-02 MED ORDER — KETOROLAC TROMETHAMINE 30 MG/ML IJ SOLN
15.0000 mg | Freq: Once | INTRAMUSCULAR | Status: AC
  Administered 2021-10-02: 15 mg via INTRAVENOUS
  Filled 2021-10-02: qty 1

## 2021-10-02 NOTE — ED Notes (Signed)
Called [facility] for transport to [facility]

## 2021-10-02 NOTE — ED Notes (Signed)
IV team at bedside to reattempt IV

## 2021-10-02 NOTE — ED Notes (Signed)
Called [facility] transfer center (Amy)

## 2021-10-02 NOTE — ED Notes (Addendum)
Applied ice and elevated left arm

## 2021-10-02 NOTE — ED Notes (Signed)
Pt taken to CT.

## 2021-10-02 NOTE — ED Notes (Signed)
Called [facility] transfer Chip Boer) for York Cerise, MD (Cardiology)

## 2021-10-02 NOTE — ED Notes (Signed)
PT ACCEPTED TO [facility] [phone]

## 2021-10-02 NOTE — ED Notes (Signed)
IV team at bedside 

## 2021-10-02 NOTE — ED Notes (Signed)
Dr. Forbach at bedside.
